# Patient Record
Sex: Male | Born: 1985 | Race: White | Hispanic: No | Marital: Single | Smoking: Never smoker
Health system: Southern US, Community
[De-identification: ages and names within clinical notes are randomized; demographics above are authoritative.]

---

## 2011-06-28 ENCOUNTER — Emergency Department: Payer: Self-pay | Admitting: Emergency Medicine

## 2011-09-21 ENCOUNTER — Emergency Department: Payer: Self-pay | Admitting: *Deleted

## 2013-02-24 IMAGING — CT CT HEAD WITHOUT CONTRAST
2 series · 16 of 30 positions shown, 20 images · non-contrast
Comparison: none

REASON FOR EXAM: headache 1 month
COMMENTS:   LMP: (Male)

[Series 2: without · axial · non-contrast · 0.45mm/px · z∈[-95,+25]mm · 13 of 30 slices shown, 17 images]
[im 3/30  brain]
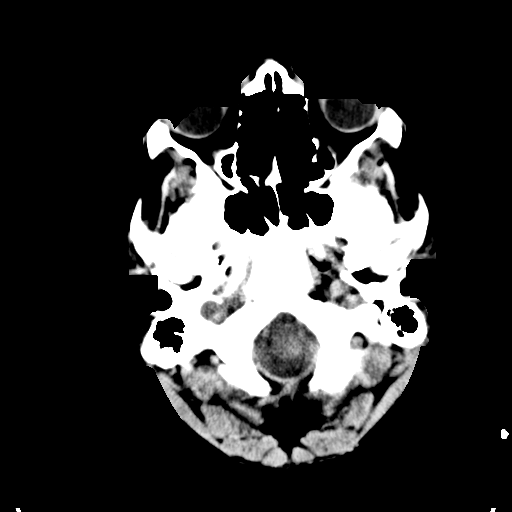
[im 3/30  bone]
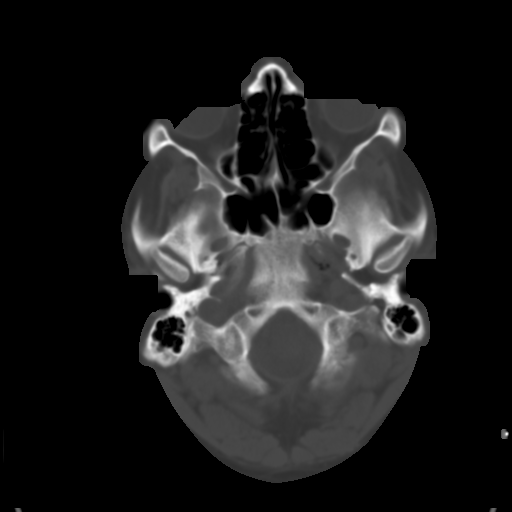
[im 5/30  brain]
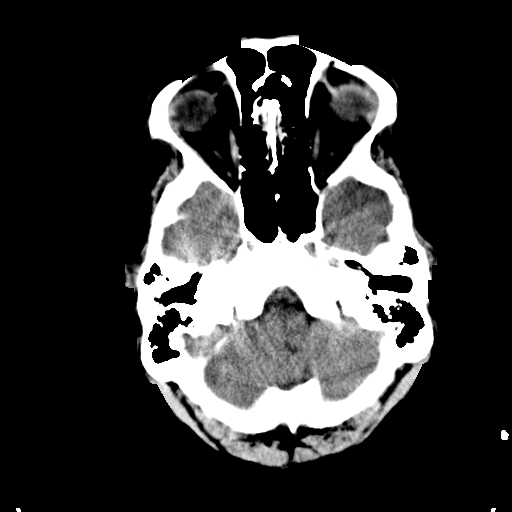
[im 7/30  brain]
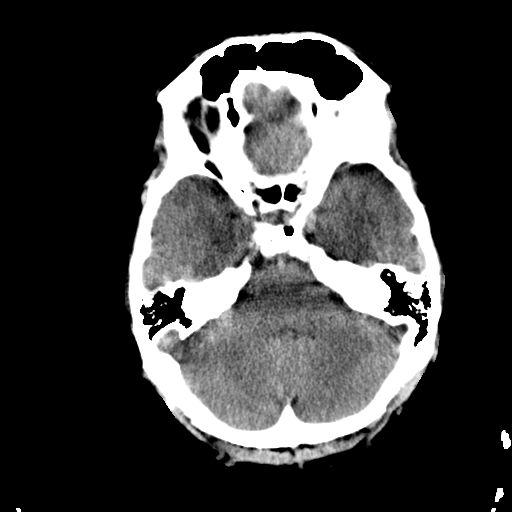
[im 9/30  brain]
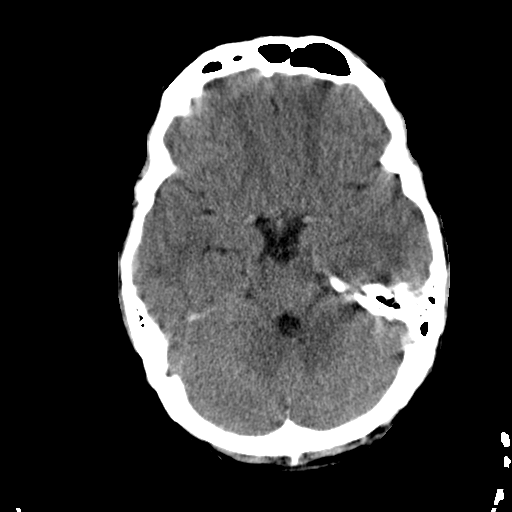
[im 11/30  brain]
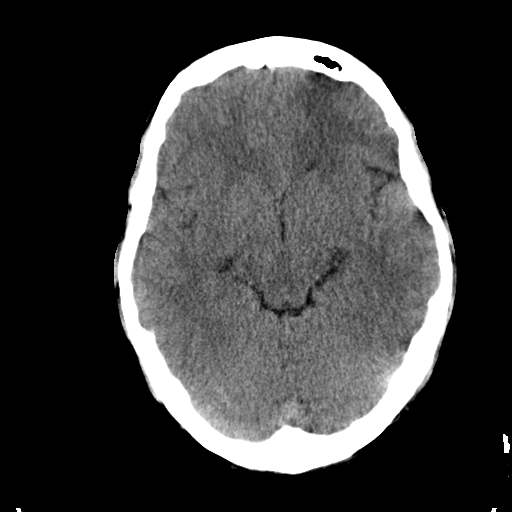
[im 11/30  bone]
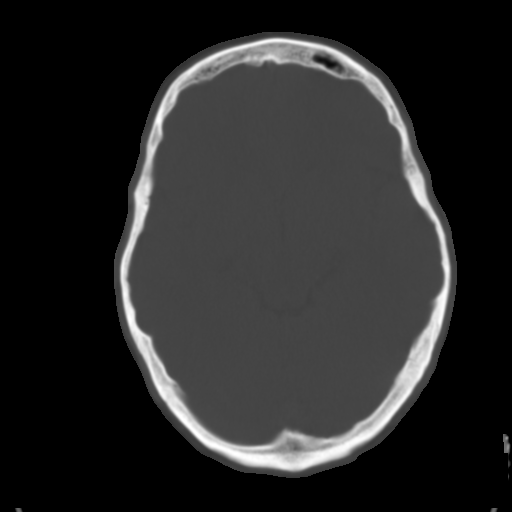
[im 13/30  brain]
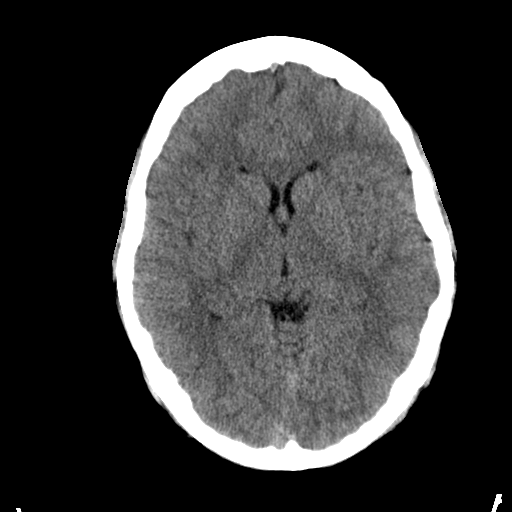
[im 15/30  brain]
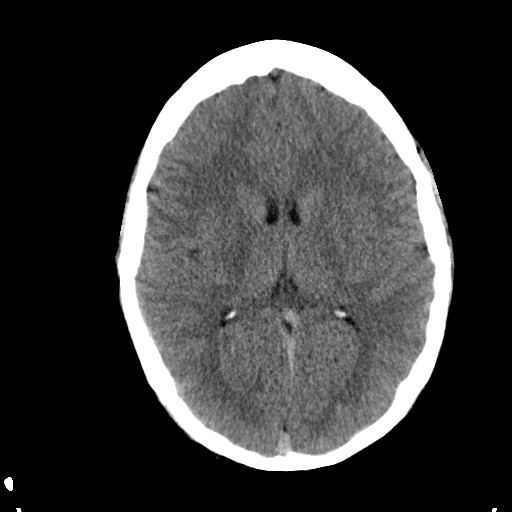
[im 17/30  brain]
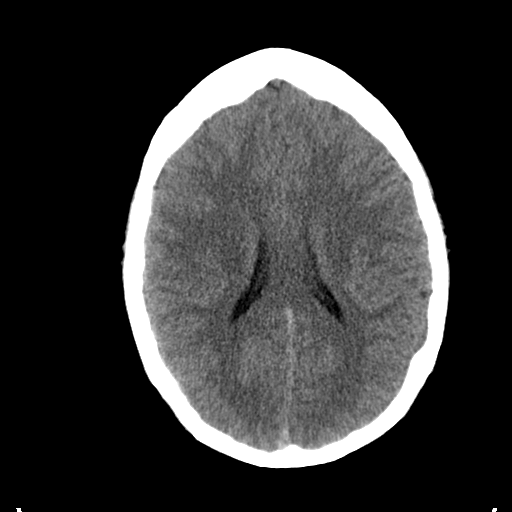
[im 19/30  brain]
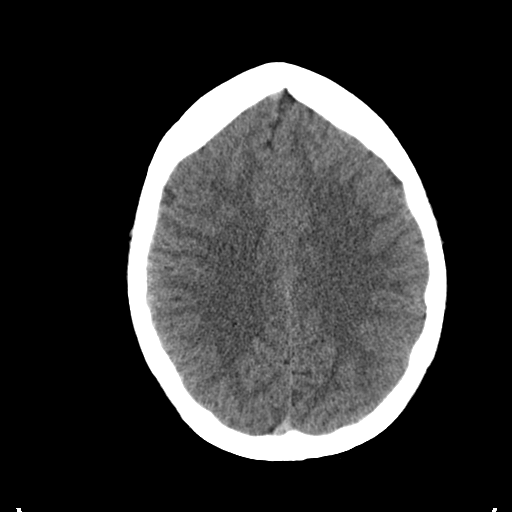
[im 19/30  bone]
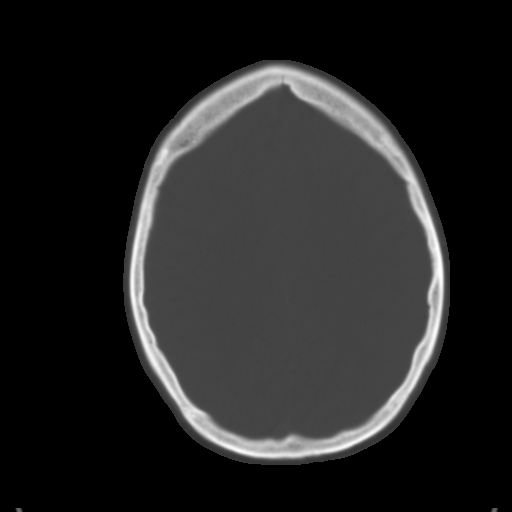
[im 21/30  brain]
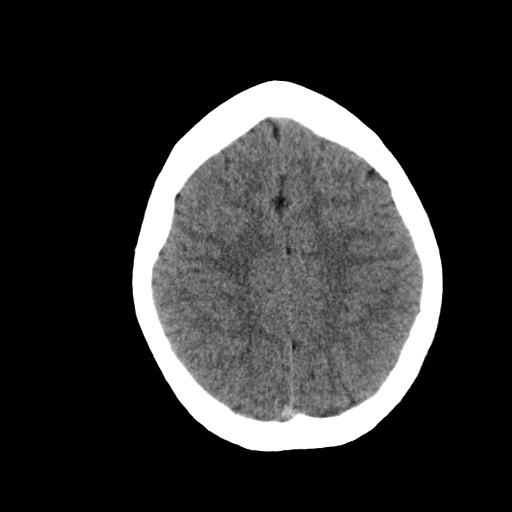
[im 23/30  brain]
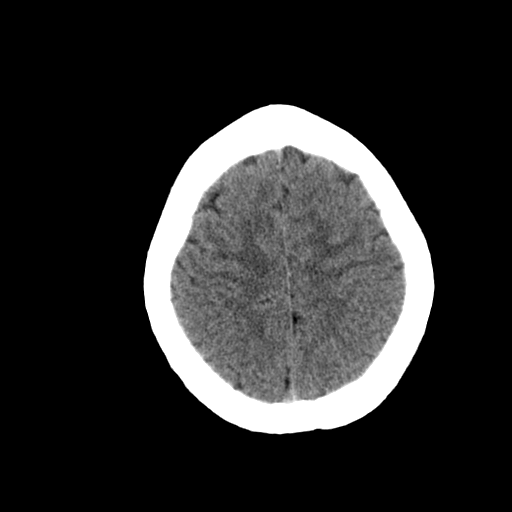
[im 25/30  brain]
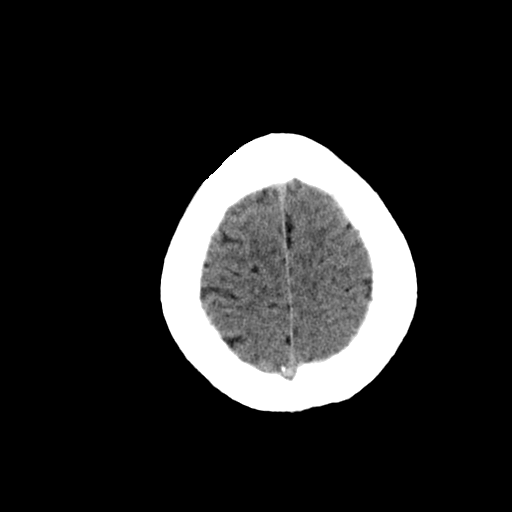
[im 27/30  brain]
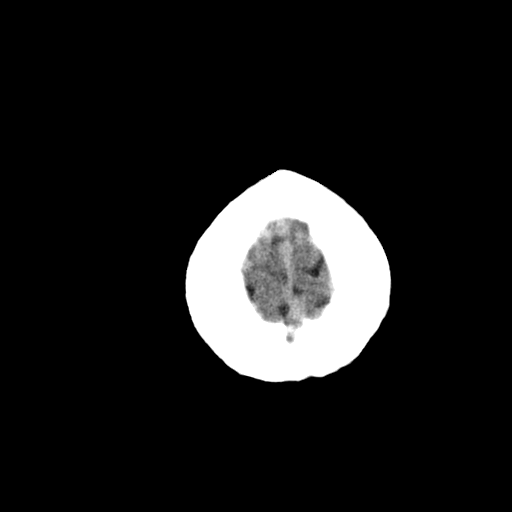
[im 27/30  bone]
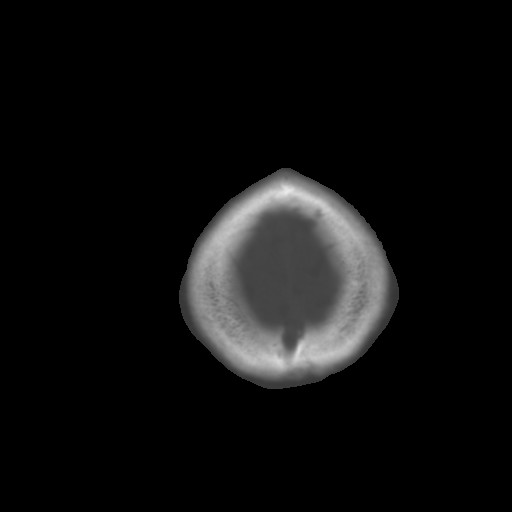

[Series 3: bone · axial · 0.45mm/px · z∈[-95,-55]mm · 3 of 30 slices shown]
[im 3/30  bone]
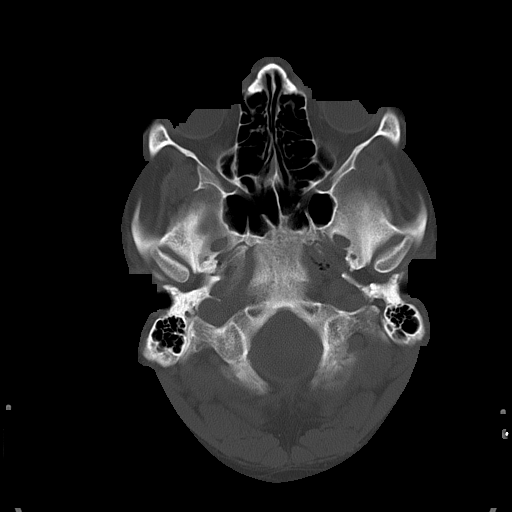
[im 7/30  bone]
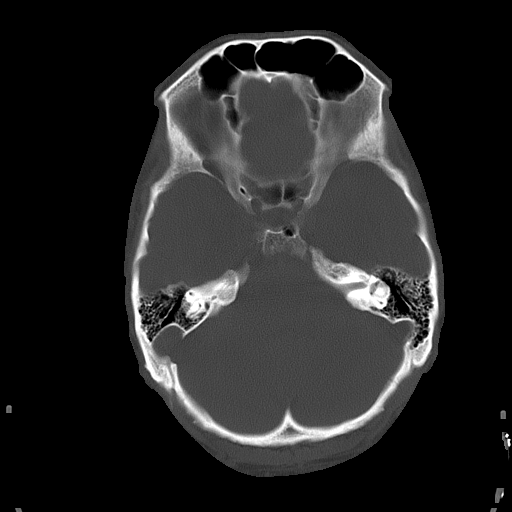
[im 11/30  bone]
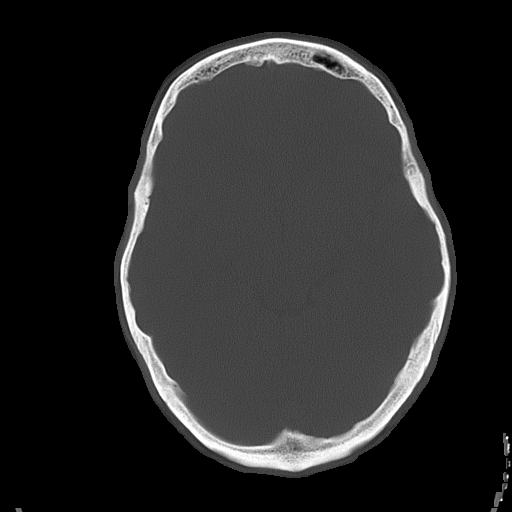

[16 of 30 positions shown; findings below may reference images not displayed]

PROCEDURE:     CT  - CT HEAD WITHOUT CONTRAST  - June 28, 2011 [DATE]

RESULT:     Noncontrast emergent CT of the brain is performed in the
standard fashion. The patient has no previous exam for comparison.

The ventricles and sulci are normal. There is no hemorrhage. There is no
focal mass, mass-effect or midline shift. There is no evidence of edema or
territorial infarct. The bone windows demonstrate normal aeration of the
paranasal sinuses and mastoid air cells. There is no skull fracture
demonstrated.
IMPRESSION: 1. No acute intracranial abnormality.

## 2013-09-27 ENCOUNTER — Ambulatory Visit: Payer: Self-pay | Admitting: Neurology

## 2015-05-21 ENCOUNTER — Emergency Department
Admission: EM | Admit: 2015-05-21 | Discharge: 2015-05-21 | Disposition: A | Discharge status: Home / Self Care | Payer: BLUE CROSS/BLUE SHIELD | Attending: Emergency Medicine | Admitting: Emergency Medicine

## 2015-05-21 ENCOUNTER — Emergency Department: Payer: BLUE CROSS/BLUE SHIELD

## 2015-05-21 ENCOUNTER — Encounter: Payer: Self-pay | Admitting: Emergency Medicine

## 2015-05-21 DIAGNOSIS — S93402A Sprain of unspecified ligament of left ankle, initial encounter: Secondary | ICD-10-CM

## 2015-05-21 DIAGNOSIS — Y9301 Activity, walking, marching and hiking: Secondary | ICD-10-CM | POA: Diagnosis not present

## 2015-05-21 DIAGNOSIS — S93602A Unspecified sprain of left foot, initial encounter: Secondary | ICD-10-CM | POA: Insufficient documentation

## 2015-05-21 DIAGNOSIS — Y998 Other external cause status: Secondary | ICD-10-CM | POA: Insufficient documentation

## 2015-05-21 DIAGNOSIS — Y9289 Other specified places as the place of occurrence of the external cause: Secondary | ICD-10-CM | POA: Insufficient documentation

## 2015-05-21 DIAGNOSIS — S99922A Unspecified injury of left foot, initial encounter: Secondary | ICD-10-CM | POA: Diagnosis present

## 2015-05-21 DIAGNOSIS — Z880 Allergy status to penicillin: Secondary | ICD-10-CM | POA: Insufficient documentation

## 2015-05-21 DIAGNOSIS — X58XXXA Exposure to other specified factors, initial encounter: Secondary | ICD-10-CM | POA: Insufficient documentation

## 2015-05-21 MED ORDER — IBUPROFEN 800 MG PO TABS: 800.0000 mg | ORAL_TABLET | Freq: Three times a day (TID) | ORAL | Status: DC | PRN

## 2015-05-21 MED ORDER — IBUPROFEN 800 MG PO TABS
800.0000 mg | ORAL_TABLET | Freq: Once | ORAL | Status: AC
  Administered 2015-05-21: 800 mg via ORAL
  Filled 2015-05-21: qty 1

## 2015-05-21 MED ORDER — TRAMADOL HCL 50 MG PO TABS: 50.0000 mg | ORAL_TABLET | Freq: Three times a day (TID) | ORAL | Status: DC | PRN

## 2015-05-21 MED ORDER — TRAMADOL HCL 50 MG PO TABS
50.0000 mg | ORAL_TABLET | Freq: Once | ORAL | Status: AC
  Administered 2015-05-21: 50 mg via ORAL
  Filled 2015-05-21: qty 1

## 2015-05-21 NOTE — ED Notes (Signed)
States he stepped wrong on sat having pain to left foot  Unable to bear wt

## 2015-05-21 NOTE — ED Provider Notes (Signed)
[facility] Emergency Department Provider Note  ____________________________________________  Time seen: Approximately 12:23 PM  I have reviewed the triage vital signs and the nursing notes.   HISTORY  Chief Complaint Foot Pain    HPI Hector Cohen is a 29 y.o. male presents to the ER for complaints of left foot and left ankle pain. Patient states that Saturday he was walking down steps and he states that he stepped on the step wrong causing onset of pain to the left foot and ankle. Denies rolling ankle or foot and denies falling. Denies fall or other injury. States pain is only in his left foot and left ankle. Denies pain radiation.  Patient states that pain is currently 5 out of 10. States that it is a aching pain with intermittent throbbing pain. Denies numbness or tingling sensations. Reports has continued to ambulate but with pain.   History reviewed. No pertinent past medical history.  There are no active problems to display for this patient.   History reviewed. No pertinent past surgical history.  No current outpatient prescriptions on file.  Allergies Penicillins  History reviewed. No pertinent family history.  Social History History  Substance Use Topics  . Smoking status: Never Smoker   . Smokeless tobacco: Not on file  . Alcohol Use: No    Review of Systems Constitutional: No fever/chills Eyes: No visual changes. ENT: No sore throat. Cardiovascular: Denies chest pain. Respiratory: Denies shortness of breath. Gastrointestinal: No abdominal pain.  No nausea, no vomiting.  No diarrhea.  No constipation. Genitourinary: Negative for dysuria. Musculoskeletal: Negative for back pain. Left foot and left ankle pain. Skin: Negative for rash. Neurological: Negative for headaches, focal weakness or numbness.  10-point ROS otherwise negative.  ____________________________________________   PHYSICAL EXAM:  VITAL SIGNS: ED  Triage Vitals  Enc Vitals Group     BP 05/21/15 1029 141/92 mmHg     Pulse Rate 05/21/15 1029 110     Resp 05/21/15 1029 18     Temp 05/21/15 1029 98 F (36.7 C)     Temp Source 05/21/15 1029 Oral     SpO2 05/21/15 1029 100 %     Weight 05/21/15 1029 280 lb (127.007 kg)     Height 05/21/15 1029 6\' 5"  (1.956 m)     Head Cir --      Peak Flow --      Pain Score 05/21/15 1029 10     Pain Loc --      Pain Edu? --      Excl. in [place]? --   Blood pressure 137/85, pulse 74, temperature 98 F (36.7 C), temperature source Oral, resp. rate 18, height 6\' 5"  (1.956 m), weight 280 lb (127.007 kg), SpO2 96 %.   Constitutional: Alert and oriented. Well appearing and in no acute distress. Eyes: Conjunctivae are normal. PERRL. EOMI. Head: Atraumatic.  Nose: No congestion/rhinnorhea.  Mouth/Throat: Mucous membranes are moist.  Neck: No stridor.  No cervical spine tenderness to palpation. Hematological/Lymphatic/Immunilogical: No cervical lymphadenopathy. Cardiovascular: Normal rate, regular rhythm. Grossly normal heart sounds.  Good peripheral circulation. Respiratory: Normal respiratory effort.  No retractions. Lungs CTAB. Gastrointestinal: Soft and nontender. No distention. Normal Bowel sounds.  No CVA tenderness. Musculoskeletal: No lower or upper extremity tenderness nor edema.  No joint effusions. Bilateral pedal pulses equal and easily palpated.  Except: Left lateral ankle and anterior ankle and upper dorsal foot mild to moderate tender palpation. No swelling, no erythema. Bilateral pulses equal and easily palpated.  Pain with ankle rotation. Full range of motion present. No posterior ankle tenderness. No Achilles tendon tenderness. Bilateral dorsiflexion and plantar flexion strong and equal bilaterally. Sensation intact to bilateral feet. Left lower extremity nontender above the left ankle. Neurologic:  Normal speech and language. No gross focal neurologic deficits are appreciated. No gait  instability. Skin:  Skin is warm, dry and intact. No rash noted. Psychiatric: Mood and affect are normal. Speech and behavior are normal.  ____________________________________________   LABS (all labs ordered are listed, but only abnormal results are displayed)  Labs Reviewed - No data to display _ RADIOLOGY EXAM: LEFT FOOT - COMPLETE 3+ VIEW  COMPARISON: None.  FINDINGS: There is no evidence of fracture or dislocation. There is no evidence of arthropathy or other focal bone abnormality. Soft tissues are unremarkable.  IMPRESSION: Negative.   Electronically Signed By: Conchita Paris M.D. On: 05/21/2015 11:15          DG Ankle Complete Left (Final result) Result time: 05/21/15 11:16:37   Final result by Rad Results In Interface (05/21/15 11:16:37)   Narrative:   CLINICAL DATA: Fall  EXAM: LEFT ANKLE COMPLETE - 3+ VIEW  COMPARISON: None.  FINDINGS: There is no evidence of fracture, dislocation, or joint effusion. There is no evidence of arthropathy or other focal bone abnormality. Soft tissues are unremarkable.  IMPRESSION: Negative.   Electronically Signed By: Franchot Gallo M.D. On: 05/21/2015 11:16   I, [name], personally viewed and evaluated these images as part of my medical decision making.    ____________________________________________   PROCEDURES  Procedure(s) performed:  SPLINT APPLICATION Date/Time: 95:28 PM Authorized by: [name] Consent: Verbal consent obtained. Risks and benefits: risks, benefits and alternatives were discussed Consent given by: patient Splint applied by: edtechnician Location details: left ankle  Splint type: stirrup Supplies used: velcro and crutches Post-procedure: The splinted body part was neurovascularly unchanged following the procedure. Patient tolerance: Patient tolerated the procedure well with no immediate  complications.   ___________________________________________   INITIAL IMPRESSION / ASSESSMENT AND PLAN / ED COURSE  Pertinent labs & imaging results that were available during my care of the patient were reviewed by me and considered in my medical decision making (see chart for details).  Well appearing. No acute distress. Left foot and ankle pain post stepping on step and states "stepped wrong". Denies fall or other injury. Pain to left foot and ankle since. Left foot and ankle xrays negative. Apply ice, elevate splint and crutches. Follow up with orthopedic for continued pain. Discussed follow up and return parameters. Patient agreed to plan.  ____________________________________________   FINAL CLINICAL IMPRESSION(S) / ED DIAGNOSES  Final diagnoses:  Foot sprain, left, initial encounter  Ankle sprain, left, initial encounter       [name], NP 05/21/15 Andrew, MD 05/21/15 1539

## 2015-05-21 NOTE — Discharge Instructions (Signed)
Take medication as prescribed. Apply ice and elevate. Wear splint and crutches as long as pain continues.  Follow-up with your primary care physician next week as needed. Follow-up with orthopedic next week as needed for continued pain. See above to call to schedule.  Return to the ER for new or worsening concerns.   Ankle Sprain An ankle sprain is an injury to the strong, fibrous tissues (ligaments) that hold the bones of your ankle joint together.  CAUSES An ankle sprain is usually caused by a fall or by twisting your ankle. Ankle sprains most commonly occur when you step on the outer edge of your foot, and your ankle turns inward. People who participate in sports are more prone to these types of injuries.  SYMPTOMS   Pain in your ankle. The pain may be present at rest or only when you are trying to stand or walk.  Swelling.  Bruising. Bruising may develop immediately or within 1 to 2 days after your injury.  Difficulty standing or walking, particularly when turning corners or changing directions. DIAGNOSIS  Your caregiver will ask you details about your injury and perform a physical exam of your ankle to determine if you have an ankle sprain. During the physical exam, your caregiver will press on and apply pressure to specific areas of your foot and ankle. Your caregiver will try to move your ankle in certain ways. An X-ray exam may be done to be sure a bone was not broken or a ligament did not separate from one of the bones in your ankle (avulsion fracture).  TREATMENT  Certain types of braces can help stabilize your ankle. Your caregiver can make a recommendation for this. Your caregiver may recommend the use of medicine for pain. If your sprain is severe, your caregiver may refer you to a surgeon who helps to restore function to parts of your skeletal system (orthopedist) or a physical therapist. [place] ice to your injury for 1-2 days or as directed by your  caregiver. Applying ice helps to reduce inflammation and pain.  Put ice in a plastic bag.  Place a towel between your skin and the bag.  Leave the ice on for 15-20 minutes at a time, every 2 hours while you are awake.  Only take over-the-counter or prescription medicines for pain, discomfort, or fever as directed by your caregiver.  Elevate your injured ankle above the level of your heart as much as possible for 2-3 days.  If your caregiver recommends crutches, use them as instructed. Gradually put weight on the affected ankle. Continue to use crutches or a cane until you can walk without feeling pain in your ankle.  If you have a plaster splint, wear the splint as directed by your caregiver. Do not rest it on anything harder than a pillow for the first 24 hours. Do not put weight on it. Do not get it wet. You may take it off to take a shower or bath.  You may have been given an elastic bandage to wear around your ankle to provide support. If the elastic bandage is too tight (you have numbness or tingling in your foot or your foot becomes cold and blue), adjust the bandage to make it comfortable.  If you have an air splint, you may blow more air into it or let air out to make it more comfortable. You may take your splint off at night and before taking a shower or bath. Wiggle your toes  in the splint several times per day to decrease swelling. SEEK MEDICAL CARE IF:   You have rapidly increasing bruising or swelling.  Your toes feel extremely cold or you lose feeling in your foot.  Your pain is not relieved with medicine. SEEK IMMEDIATE MEDICAL CARE IF:  Your toes are numb or blue.  You have severe pain that is increasing. MAKE SURE YOU:   Understand these instructions.  Will watch your condition.  Will get help right away if you are not doing well or get worse. Document Released: 10/13/2005 Document Revised: 07/07/2012 Document Reviewed: 10/25/2011 [facility] Patient Information  2015 [place], [place]. This information is not intended to replace advice given to you by your health care provider. Make sure you discuss any questions you have with your health care provider.

## 2015-06-26 ENCOUNTER — Ambulatory Visit (INDEPENDENT_AMBULATORY_CARE_PROVIDER_SITE_OTHER): Payer: BLUE CROSS/BLUE SHIELD

## 2015-06-26 ENCOUNTER — Ambulatory Visit (INDEPENDENT_AMBULATORY_CARE_PROVIDER_SITE_OTHER): Payer: BLUE CROSS/BLUE SHIELD | Admitting: Podiatry

## 2015-06-26 ENCOUNTER — Encounter: Payer: Self-pay | Admitting: Podiatry

## 2015-06-26 VITALS — BP 167/119 | HR 86 | Resp 18

## 2015-06-26 DIAGNOSIS — Q667 Congenital pes cavus: Secondary | ICD-10-CM | POA: Diagnosis not present

## 2015-06-26 DIAGNOSIS — M779 Enthesopathy, unspecified: Secondary | ICD-10-CM | POA: Diagnosis not present

## 2015-06-26 DIAGNOSIS — L84 Corns and callosities: Secondary | ICD-10-CM

## 2015-06-26 DIAGNOSIS — R52 Pain, unspecified: Secondary | ICD-10-CM

## 2015-06-26 DIAGNOSIS — M216X9 Other acquired deformities of unspecified foot: Secondary | ICD-10-CM

## 2015-06-26 MED ORDER — MELOXICAM 15 MG PO TABS: 15.0000 mg | ORAL_TABLET | Freq: Every day | ORAL | Status: DC

## 2015-06-26 NOTE — Patient Instructions (Signed)
Peroneal Tendinitis with Rehab Tendonitis is inflammation of a tendon. Inflammation of the tendons on the back of the outer ankle (peroneal tendons) is known as peroneal tendonitis. The peroneal tendons are responsible for connecting the muscles that allow you to stand on your tiptoes to the bones of the ankle. For this reason, peroneal tendonitis often causes pain when trying to complete such motions. Peroneal tendonitis often involves a tear (strain) of the peroneal tendons. Strains are classified into three categories. Grade 1 strains cause pain, but the tendon is not lengthened. Grade 2 strains include a lengthened ligament, due to the ligament being stretched or partially ruptured. With grade 2 strains there is still function, although function may be decreased. Grade 3 strains involve a complete tear of the tendon or muscle, and function is usually impaired. SYMPTOMS   Pain, tenderness, swelling, warmth, or redness over the back of the outer side of the ankle, the outer part of the mid-foot, or the bottom of the arch.  Pain that gets worse with ankle motion (especially when pushing off or pushing down with the front of the foot), or when standing on the ball of the foot or pushing the foot outward.  Crackling sound (crepitation) when the tendon is moved or touched. CAUSES  Peroneal tendinitis occurs when injury to the peroneal tendons causes the body to respond with inflammation. Common causes of injury include:  An overuse injury, in which the groove behind the outer ankle (where the tendon is located) causes wear on the tendon.  A sudden stress placed on the tendon, such as from an increase in the intensity, frequency, or duration of training.  Direct hit (trauma) to the tendon.  Return to activity too soon after a previous ankle injury. RISK INCREASES WITH:  Sports that require sudden, repetitive pushing off of the foot, such as jumping or quick starts.  Kicking and running sports,  especially running down hills or long distances.  Poor strength and flexibility.  Previous injury to the foot, ankle, or leg. PREVENTION  Warm up and stretch properly before activity.  Allow for adequate recovery between workouts.  Maintain physical fitness:  Strength, flexibility, and endurance.  Cardiovascular fitness.  Complete rehabilitation after previous injury. PROGNOSIS  If treated properly, peroneal tendonitis usually heals within 6 weeks.  RELATED COMPLICATIONS  Longer healing time, if not properly treated or if not given enough time to heal.  Recurring symptoms if activity is resumed too soon, with overuse, or when using poor technique.  If untreated, tendinitis may result in tendon rupture, requiring surgery. TREATMENT  Treatment first involves the use of ice and medicine to reduce pain and inflammation. The use of strengthening and stretching exercises may help reduce pain with activity. These exercises may be performed at home or with a therapist. Sometimes, the foot and ankle will be restrained for 10 to 14 days to promote healing. Your caregiver may advise that you place a heel lift in your shoes to reduce the stress placed on the tendon. If nonsurgical treatment is unsuccessful, surgery to remove the inflamed tendon lining (sheath) may be advised.  MEDICATION   If pain medicine is needed, nonsteroidal anti-inflammatory medicines (aspirin and ibuprofen), or other minor pain relievers (acetaminophen), are often advised.  Do not take pain medicine for 7 days before surgery.  Prescription pain relievers may be given, if your caregiver thinks they are needed. Use only as directed and only as much as you need. HEAT AND COLD  Cold treatment (icing) should   be applied for 10 to 15 minutes every 2 to 3 hours for inflammation and pain, and immediately after activity that aggravates your symptoms. Use ice packs or an ice massage.  Heat treatment may be used before  performing stretching and strengthening activities prescribed by your caregiver, physical therapist, or athletic trainer. Use a heat pack or a warm water soak. SEEK MEDICAL CARE IF:  Symptoms get worse or do not improve in 2 to 4 weeks, despite treatment.  New, unexplained symptoms develop. (Drugs used in treatment may produce side effects.) EXERCISES RANGE OF MOTION (ROM) AND STRETCHING EXERCISES - Peroneal Tendinitis These exercises may help you when beginning to rehabilitate your injury. Your symptoms may resolve with or without further involvement from your physician, physical therapist or athletic trainer. While completing these exercises, remember:   Restoring tissue flexibility helps normal motion to return to the joints. This allows healthier, less painful movement and activity.  An effective stretch should be held for at least 30 seconds.  A stretch should never be painful. You should only feel a gentle lengthening or release in the stretched tissue. RANGE OF MOTION - Ankle Eversion  Sit with your right / left ankle crossed over your opposite knee.  Grip your foot with your opposite hand, placing your thumb on the top of your foot and your fingers across the bottom of your foot.  Gently push your foot downward with a slight rotation, so your littlest toes rise slightly toward the ceiling.  You should feel a gentle stretch on the inside of your ankle. Hold the stretch for __________ seconds. Repeat __________ times. Complete this exercise __________ times per day.  RANGE OF MOTION - Ankle Inversion  Sit with your right / left ankle crossed over your opposite knee.  Grip your foot with your opposite hand, placing your thumb on the bottom of your foot and your fingers across the top of your foot.  Gently pull your foot so the smallest toe comes toward you and your thumb pushes the inside of the ball of your foot away from you.  You should feel a gentle stretch on the outside of  your ankle. Hold the stretch for __________ seconds. Repeat __________ times. Complete this exercise __________ times per day.  RANGE OF MOTION - Ankle Plantar Flexion  Sit with your right / left leg crossed over your opposite knee.  Use your opposite hand to pull the top of your foot and toes toward you.  You should feel a gentle stretch on the top of your foot and ankle. Hold this position for __________ seconds. Repeat __________ times. Complete __________ times per day.  STRETCH - Gastroc, Standing  Place your hands on a wall.  Extend your right / left leg behind you, keeping the front knee somewhat bent.  Slightly point your toes inward on your back foot.  Keeping your right / left heel on the floor and your knee straight, shift your weight toward the wall, not allowing your back to arch.  You should feel a gentle stretch in the calf. Hold this position for __________ seconds. Repeat __________ times. Complete this stretch __________ times per day. STRETCH - Soleus, Standing  Place your hands on a wall.  Extend your right / left leg behind you, keeping the other knee somewhat bent.  Slightly point your toes inward on your back foot.  Keep your heel on the floor, bend your back knee, and slightly shift your weight over the back leg so that   you feel a gentle stretch deep in your back calf.  Hold this position for __________ seconds. Repeat __________ times. Complete this stretch __________ times per day. STRETCH - Gastrocsoleus, Standing Note: This exercise can place a lot of stress on your foot and ankle. Please complete this exercise only if specifically instructed by your caregiver.   Place the ball of your right / left foot on a step, keeping your other foot firmly on the same step.  Hold on to the wall or a rail for balance.  Slowly lift your other foot, allowing your body weight to press your heel down over the edge of the step.  You should feel a stretch in your  right / left calf.  Hold this position for __________ seconds.  Repeat this exercise with a slight bend in your knee. Repeat __________ times. Complete this stretch __________ times per day.  STRENGTHENING EXERCISES - Peroneal Tendinitis  These exercises may help you when beginning to rehabilitate your injury. They may resolve your symptoms with or without further involvement from your physician, physical therapist or athletic trainer. While completing these exercises, remember:   Muscles can gain both the endurance and the strength needed for everyday activities through controlled exercises.  Complete these exercises as instructed by your physician, physical therapist or athletic trainer. Increase the resistance and repetitions only as guided by your caregiver. STRENGTH - Dorsiflexors  Secure a rubber exercise band or tubing to a fixed object (table, pole) and loop the other end around your right / left foot.  Sit on the floor facing the fixed object. The band should be slightly tense when your foot is relaxed.  Slowly draw your foot back toward you, using your ankle and toes.  Hold this position for __________ seconds. Slowly release the tension in the band and return your foot to the starting position. Repeat __________ times. Complete this exercise __________ times per day.  STRENGTH - Towel Curls  Sit in a chair, on a non-carpeted surface.  Place your foot on a towel, keeping your heel on the floor.  Pull the towel toward your heel only by curling your toes. Keep your heel on the floor.  If instructed by your physician, physical therapist or athletic trainer, add weight to the end of the towel. Repeat __________ times. Complete this exercise __________ times per day. STRENGTH - Ankle Eversion   Secure one end of a rubber exercise band or tubing to a fixed object (table, pole). Loop the other end around your foot, just before your toes.  Place your fists between your knees.  This will focus your strengthening at your ankle.  Drawing the band across your opposite foot, away from the pole, slowly, pull your little toe out and up. Make sure the band is positioned to resist the entire motion.  Hold this position for __________ seconds.  Have your muscles resist the band, as it slowly pulls your foot back to the starting position. Repeat __________ times. Complete this exercise __________ times per day.  Document Released: 10/13/2005 Document Revised: 02/27/2014 Document Reviewed: 01/25/2009 [employer] Patient Information 2015 [employer]. This information is not intended to replace advice given to you by your health care provider. Make sure you discuss any questions you have with your health care provider.  

## 2015-06-26 NOTE — Progress Notes (Signed)
   Subjective:    Patient ID: Hector Cohen, male    DOB: Aug 14, 1986, 29 y.o.   MRN: [id number]  HPI  29 year old male presents the office with concerns of left foot pain which is been ongoing for approximate 4 weeks. He states the pain started after he stated for period of 4 hours and the dress shoe.he describes it as a throbbing pain. He states he has pain overlying the outside portion of left foot for which she points to the submetatarsal 5 area overlying hyperkeratotic lesion. He also states he has some pain on the outside part of his foot for which she points the fifth metatarsal base. He states he has pain when he stands on his feet for prolonged periods of time. He denies any swelling or redness. He does state that approximately one month ago before this started he did sprain his ankle for which she was seen in the emergency department. He states that pain is resolved. He said no prior treatment for the foot. No other complaints at this time.  Review of Systems  Constitutional: Positive for activity change.  HENT: Positive for sinus pressure.   Musculoskeletal: Positive for gait problem.  Skin: Positive for rash.  Allergic/Immunologic: Positive for environmental allergies.  Psychiatric/Behavioral: The patient is nervous/anxious.        Objective:   Physical Exam AAO x3, NAD DP/PT pulses palpable bilaterally, CRT less than 3 seconds Protective sensation intact with Simms Weinstein monofilament, vibratory sensation intact, Achilles tendon reflex intact There are hyperkeratotic lesions bilateral submetatarsal 1, 5, plantar heel to the left foot. There is an increase in calcaneal inclination angle upon weightbearing. Patient states that he has multiple family members without high arch foot. There is tenderness over the submetatarsal 5 hyperkeratotic lesion the left foot. Upon debridement no underlying ulceration, drainage or other signs of infection. There is tenderness palpation  overlying the fifth metatarsal base just proximally along the insertion of the peroneal tendon. There is no pain with vibratory sensation overlying the area. No pain with eversion. Peroneal tendons appear to be intact. There is no other areas of tenderness to bilateral lower extremities. No areas of tenderness to bilateral lower extremities. MMT 5/5, ROM WNL.  No open lesions or pre-ulcerative lesions.  No overlying edema, erythema, increase in warmth to bilateral lower extremities.  No pain with calf compression, swelling, warmth, erythema bilaterally.      Assessment & Plan:  29 year old male with left foot pain, insertionalperoneal tendinitis, symptomatic hyperkeratotic lesions. -X-rays were obtained and reviewed with the patient.  -Treatment options discussed including all alternatives, risks, and complications -Discussed likely etiology of his symptoms. -Dispensed Tri-Lock ankle brace to help control the peroneal tendons. Discussed stretching/rehabilitation exercises. As the pain subsides he can wean off the brace. -Prescribed mobic. Discussed side effects of the medication and directed to stop if any are to occur and call the office.  -Hyperkeratotic lesion shortly debrided without complication/bleeding. -Given his foot type by deeply that he could benefit well from orthotic. He'll try an over-the-counter insert. I discussed with him what to look for in purchasing  These. -Follow-up 4 weeks or sooner if any problems arise. In the meantime, encouraged to call the office with any questions, concerns, change in symptoms. Follow-up with PCP for other issues mentioned in the ROS.  Celesta Gentile, DPM

## 2015-07-24 ENCOUNTER — Ambulatory Visit (INDEPENDENT_AMBULATORY_CARE_PROVIDER_SITE_OTHER): Payer: BLUE CROSS/BLUE SHIELD | Admitting: Podiatry

## 2015-07-24 ENCOUNTER — Encounter: Payer: Self-pay | Admitting: Podiatry

## 2015-07-24 VITALS — BP 139/86 | HR 84 | Resp 18

## 2015-07-24 DIAGNOSIS — M779 Enthesopathy, unspecified: Secondary | ICD-10-CM | POA: Diagnosis not present

## 2015-07-24 NOTE — Progress Notes (Signed)
Patient ID: Hector Cohen, male   DOB: 06-07-1986, 29 y.o.   MRN: [id number]  Subjective: 29 year old male presents the office they for follow-up evaluation of left foot pain. He states that he is continuing  To wear the ankle brace if he is standing or walking for periods of time. He does not wear it all the time. He states that his pain is significantly improved compared to what it was last appointment. He continues his rehabilitation exercises as well. He has been taking anti-inflammatories as needed. No other complaints at this time in no acute changes since last appointment.  Objective: AAO 3, NAD Neurovascular status intact and unchanged At this time there is no tenderness palpation on the lateral aspect of the ankle or along the peroneal tendon and the insertion of the fifth metatarsal base. There is no pain with eversion. The peroneal tendons appear to be intact. There is increasing calcaneal inclination angle bilaterally. There is no other areas of tenderness to bilateral lower  extremities. No pinpoint bony tenderness. There is no overt edema, erythema, increase in warmth. No open lesions or pre-ulcerative lesions. There is no pain with calf compression, sewing, warmth, erythema.  Assessment: 29 year old male with resolving tendinitis left foot  Plan: -Treatment options discussed including all alternatives, risks, and complications -Recommended continued rehabilitation stretching exercises for peroneal tendon. Anti-inflammatories as needed. Continue with supportive shoe gear. I also believe that he would likely benefit from custom orthotics. Due to cost to look at an over-the-counter pair. I discussed the multiple performed purchase in these. -Follow-up with me if symptoms are not complete the results next 4 weeks or sooner if any problems are to arise. In the meantime I encouraged him to call the office with any questions, concerns, change in symptoms.  Celesta Gentile, DPM

## 2015-07-24 NOTE — Patient Instructions (Signed)
Peroneal Tendinitis with Rehab Tendonitis is inflammation of a tendon. Inflammation of the tendons on the back of the outer ankle (peroneal tendons) is known as peroneal tendonitis. The peroneal tendons are responsible for connecting the muscles that allow you to stand on your tiptoes to the bones of the ankle. For this reason, peroneal tendonitis often causes pain when trying to complete such motions. Peroneal tendonitis often involves a tear (strain) of the peroneal tendons. Strains are classified into three categories. Grade 1 strains cause pain, but the tendon is not lengthened. Grade 2 strains include a lengthened ligament, due to the ligament being stretched or partially ruptured. With grade 2 strains there is still function, although function may be decreased. Grade 3 strains involve a complete tear of the tendon or muscle, and function is usually impaired. SYMPTOMS   Pain, tenderness, swelling, warmth, or redness over the back of the outer side of the ankle, the outer part of the mid-foot, or the bottom of the arch.  Pain that gets worse with ankle motion (especially when pushing off or pushing down with the front of the foot), or when standing on the ball of the foot or pushing the foot outward.  Crackling sound (crepitation) when the tendon is moved or touched. CAUSES  Peroneal tendinitis occurs when injury to the peroneal tendons causes the body to respond with inflammation. Common causes of injury include:  An overuse injury, in which the groove behind the outer ankle (where the tendon is located) causes wear on the tendon.  A sudden stress placed on the tendon, such as from an increase in the intensity, frequency, or duration of training.  Direct hit (trauma) to the tendon.  Return to activity too soon after a previous ankle injury. RISK INCREASES WITH:  Sports that require sudden, repetitive pushing off of the foot, such as jumping or quick starts.  Kicking and running sports,  especially running down hills or long distances.  Poor strength and flexibility.  Previous injury to the foot, ankle, or leg. PREVENTION  Warm up and stretch properly before activity.  Allow for adequate recovery between workouts.  Maintain physical fitness:  Strength, flexibility, and endurance.  Cardiovascular fitness.  Complete rehabilitation after previous injury. PROGNOSIS  If treated properly, peroneal tendonitis usually heals within 6 weeks.  RELATED COMPLICATIONS  Longer healing time, if not properly treated or if not given enough time to heal.  Recurring symptoms if activity is resumed too soon, with overuse, or when using poor technique.  If untreated, tendinitis may result in tendon rupture, requiring surgery. TREATMENT  Treatment first involves the use of ice and medicine to reduce pain and inflammation. The use of strengthening and stretching exercises may help reduce pain with activity. These exercises may be performed at home or with a therapist. Sometimes, the foot and ankle will be restrained for 10 to 14 days to promote healing. Your caregiver may advise that you place a heel lift in your shoes to reduce the stress placed on the tendon. If nonsurgical treatment is unsuccessful, surgery to remove the inflamed tendon lining (sheath) may be advised.  MEDICATION   If pain medicine is needed, nonsteroidal anti-inflammatory medicines (aspirin and ibuprofen), or other minor pain relievers (acetaminophen), are often advised.  Do not take pain medicine for 7 days before surgery.  Prescription pain relievers may be given, if your caregiver thinks they are needed. Use only as directed and only as much as you need. HEAT AND COLD  Cold treatment (icing) should   be applied for 10 to 15 minutes every 2 to 3 hours for inflammation and pain, and immediately after activity that aggravates your symptoms. Use ice packs or an ice massage.  Heat treatment may be used before  performing stretching and strengthening activities prescribed by your caregiver, physical therapist, or athletic trainer. Use a heat pack or a warm water soak. SEEK MEDICAL CARE IF:  Symptoms get worse or do not improve in 2 to 4 weeks, despite treatment.  New, unexplained symptoms develop. (Drugs used in treatment may produce side effects.) EXERCISES RANGE OF MOTION (ROM) AND STRETCHING EXERCISES - Peroneal Tendinitis These exercises may help you when beginning to rehabilitate your injury. Your symptoms may resolve with or without further involvement from your physician, physical therapist or athletic trainer. While completing these exercises, remember:   Restoring tissue flexibility helps normal motion to return to the joints. This allows healthier, less painful movement and activity.  An effective stretch should be held for at least 30 seconds.  A stretch should never be painful. You should only feel a gentle lengthening or release in the stretched tissue. RANGE OF MOTION - Ankle Eversion  Sit with your right / left ankle crossed over your opposite knee.  Grip your foot with your opposite hand, placing your thumb on the top of your foot and your fingers across the bottom of your foot.  Gently push your foot downward with a slight rotation, so your littlest toes rise slightly toward the ceiling.  You should feel a gentle stretch on the inside of your ankle. Hold the stretch for __________ seconds. Repeat __________ times. Complete this exercise __________ times per day.  RANGE OF MOTION - Ankle Inversion  Sit with your right / left ankle crossed over your opposite knee.  Grip your foot with your opposite hand, placing your thumb on the bottom of your foot and your fingers across the top of your foot.  Gently pull your foot so the smallest toe comes toward you and your thumb pushes the inside of the ball of your foot away from you.  You should feel a gentle stretch on the outside of  your ankle. Hold the stretch for __________ seconds. Repeat __________ times. Complete this exercise __________ times per day.  RANGE OF MOTION - Ankle Plantar Flexion  Sit with your right / left leg crossed over your opposite knee.  Use your opposite hand to pull the top of your foot and toes toward you.  You should feel a gentle stretch on the top of your foot and ankle. Hold this position for __________ seconds. Repeat __________ times. Complete __________ times per day.  STRETCH - Gastroc, Standing  Place your hands on a wall.  Extend your right / left leg behind you, keeping the front knee somewhat bent.  Slightly point your toes inward on your back foot.  Keeping your right / left heel on the floor and your knee straight, shift your weight toward the wall, not allowing your back to arch.  You should feel a gentle stretch in the calf. Hold this position for __________ seconds. Repeat __________ times. Complete this stretch __________ times per day. STRETCH - Soleus, Standing  Place your hands on a wall.  Extend your right / left leg behind you, keeping the other knee somewhat bent.  Slightly point your toes inward on your back foot.  Keep your heel on the floor, bend your back knee, and slightly shift your weight over the back leg so that   you feel a gentle stretch deep in your back calf.  Hold this position for __________ seconds. Repeat __________ times. Complete this stretch __________ times per day. STRETCH - Gastrocsoleus, Standing Note: This exercise can place a lot of stress on your foot and ankle. Please complete this exercise only if specifically instructed by your caregiver.   Place the ball of your right / left foot on a step, keeping your other foot firmly on the same step.  Hold on to the wall or a rail for balance.  Slowly lift your other foot, allowing your body weight to press your heel down over the edge of the step.  You should feel a stretch in your  right / left calf.  Hold this position for __________ seconds.  Repeat this exercise with a slight bend in your knee. Repeat __________ times. Complete this stretch __________ times per day.  STRENGTHENING EXERCISES - Peroneal Tendinitis  These exercises may help you when beginning to rehabilitate your injury. They may resolve your symptoms with or without further involvement from your physician, physical therapist or athletic trainer. While completing these exercises, remember:   Muscles can gain both the endurance and the strength needed for everyday activities through controlled exercises.  Complete these exercises as instructed by your physician, physical therapist or athletic trainer. Increase the resistance and repetitions only as guided by your caregiver. STRENGTH - Dorsiflexors  Secure a rubber exercise band or tubing to a fixed object (table, pole) and loop the other end around your right / left foot.  Sit on the floor facing the fixed object. The band should be slightly tense when your foot is relaxed.  Slowly draw your foot back toward you, using your ankle and toes.  Hold this position for __________ seconds. Slowly release the tension in the band and return your foot to the starting position. Repeat __________ times. Complete this exercise __________ times per day.  STRENGTH - Towel Curls  Sit in a chair, on a non-carpeted surface.  Place your foot on a towel, keeping your heel on the floor.  Pull the towel toward your heel only by curling your toes. Keep your heel on the floor.  If instructed by your physician, physical therapist or athletic trainer, add weight to the end of the towel. Repeat __________ times. Complete this exercise __________ times per day. STRENGTH - Ankle Eversion   Secure one end of a rubber exercise band or tubing to a fixed object (table, pole). Loop the other end around your foot, just before your toes.  Place your fists between your knees.  This will focus your strengthening at your ankle.  Drawing the band across your opposite foot, away from the pole, slowly, pull your little toe out and up. Make sure the band is positioned to resist the entire motion.  Hold this position for __________ seconds.  Have your muscles resist the band, as it slowly pulls your foot back to the starting position. Repeat __________ times. Complete this exercise __________ times per day.  Document Released: 10/13/2005 Document Revised: 02/27/2014 Document Reviewed: 01/25/2009 [employer] Patient Information 2015 [employer]. This information is not intended to replace advice given to you by your health care provider. Make sure you discuss any questions you have with your health care provider.  

## 2016-11-28 ENCOUNTER — Other Ambulatory Visit: Payer: Self-pay | Admitting: Family Medicine

## 2016-11-28 DIAGNOSIS — R1011 Right upper quadrant pain: Secondary | ICD-10-CM

## 2016-12-05 ENCOUNTER — Ambulatory Visit
Admission: RE | Admit: 2016-12-05 | Discharge: 2016-12-05 | Disposition: A | Discharge status: Home / Self Care | Payer: BLUE CROSS/BLUE SHIELD | Source: Ambulatory Visit | Attending: Family Medicine | Admitting: Family Medicine

## 2016-12-05 DIAGNOSIS — R1011 Right upper quadrant pain: Secondary | ICD-10-CM | POA: Insufficient documentation

## 2016-12-05 DIAGNOSIS — K760 Fatty (change of) liver, not elsewhere classified: Secondary | ICD-10-CM | POA: Insufficient documentation

## 2017-12-03 IMAGING — US US ABDOMEN LIMITED
1 series · 14 of 25 positions shown · non-contrast
Comparison: None.

CLINICAL DATA: Right upper quadrant abdominal pain.

EXAM:
US ABDOMEN LIMITED - RIGHT UPPER QUADRANT

[Series 1: us abdomen limited · 0.26mm/px · 14 of 40 slices shown]
[im 1/40]
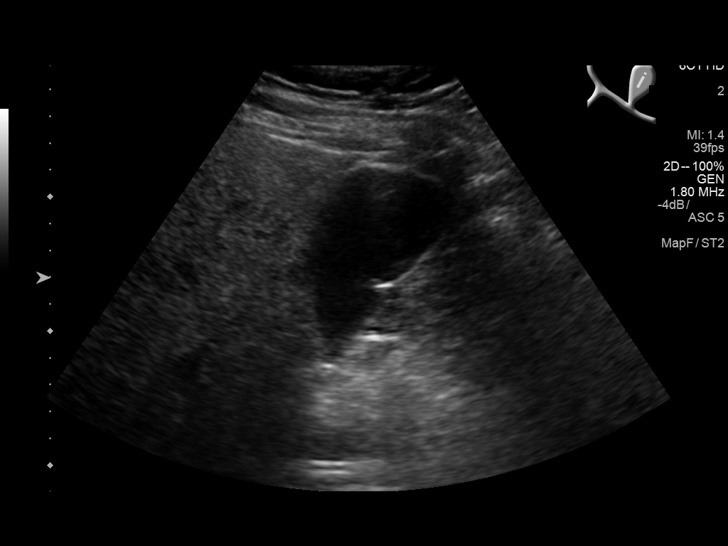
[im 4/40]
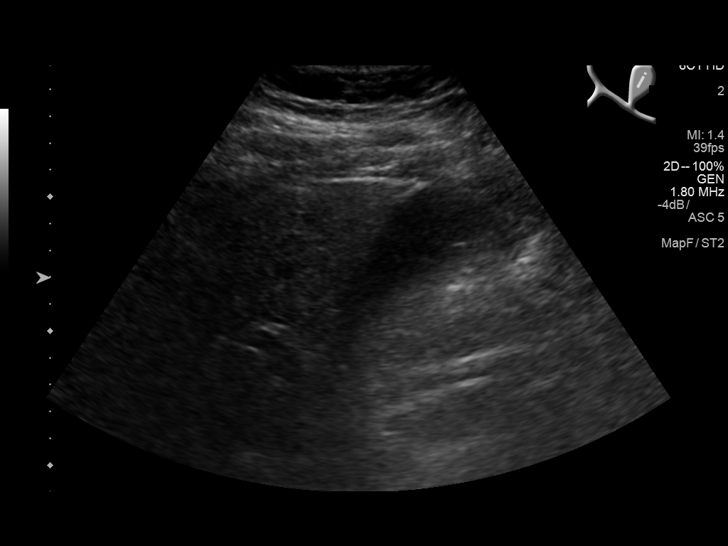
[im 7/40]
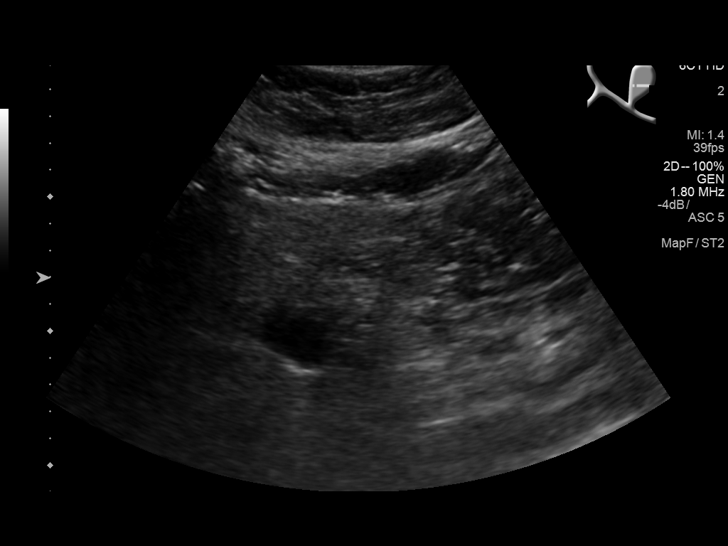
[im 10/40]
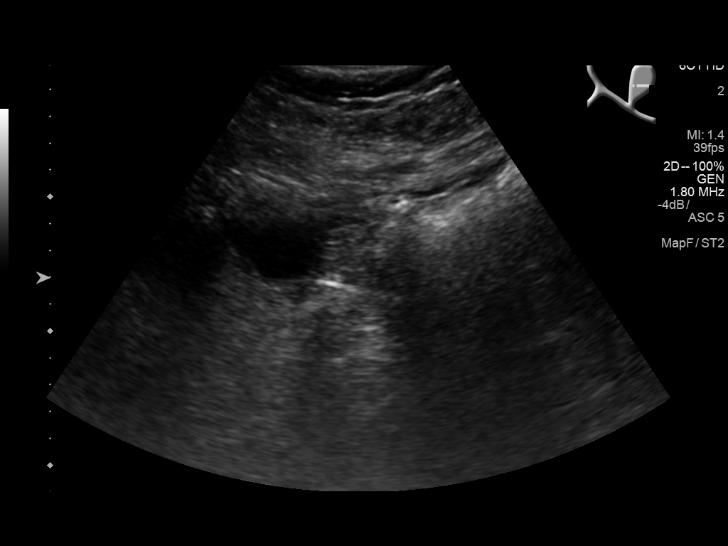
[im 14/40]
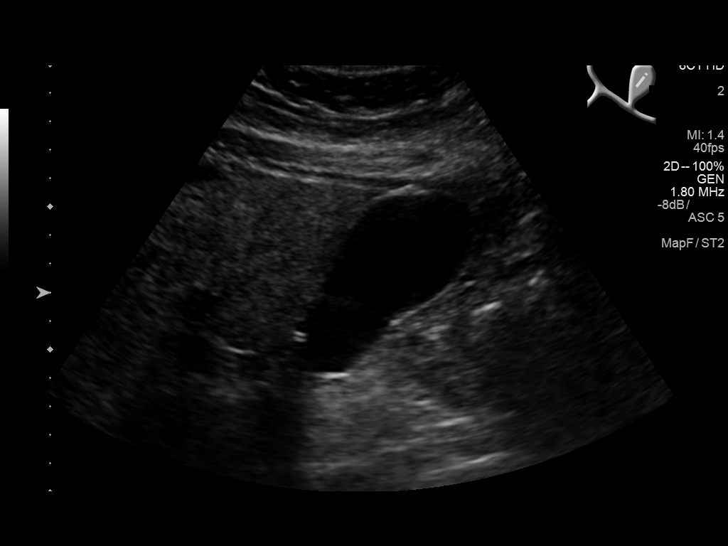
[im 15/40]
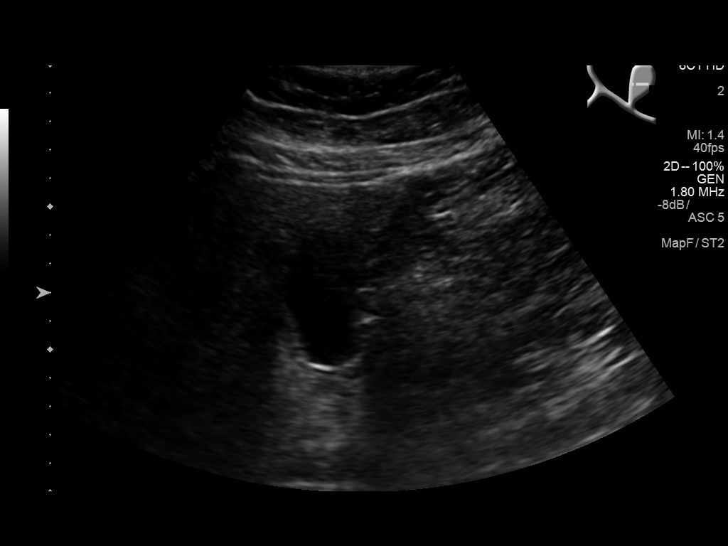
[im 18/40]
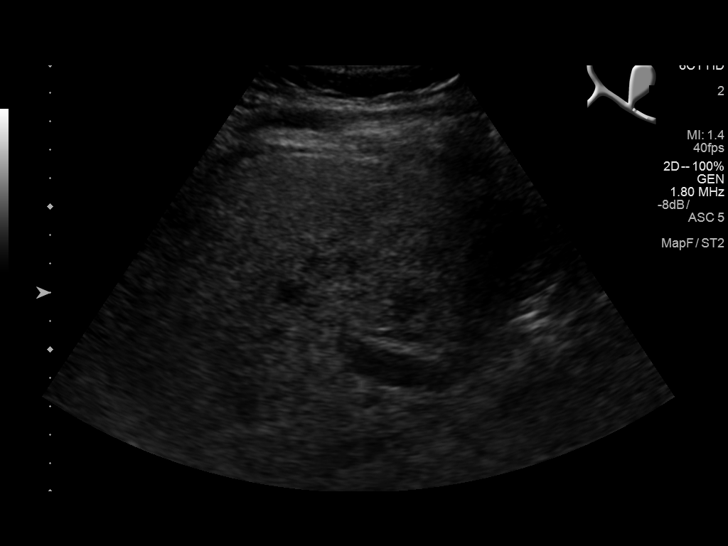
[im 22/40]
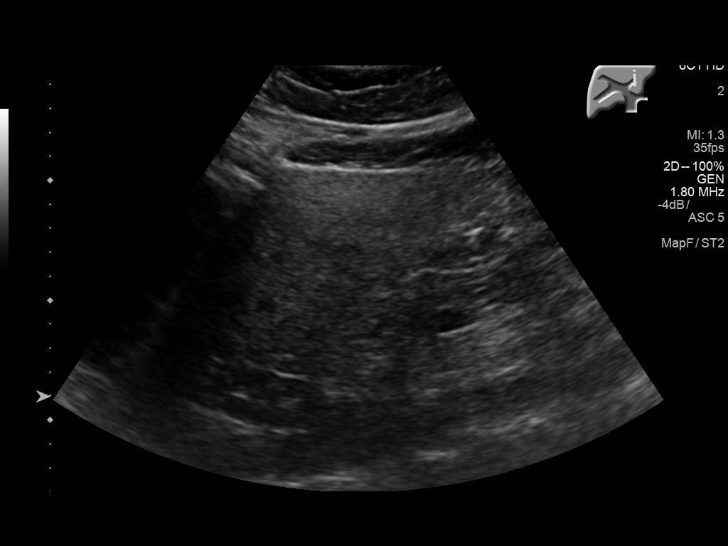
[im 25/40]
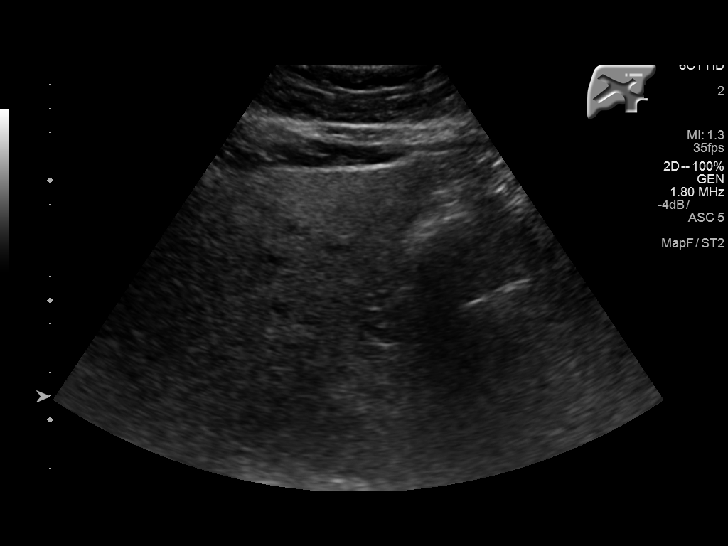
[im 27/40]
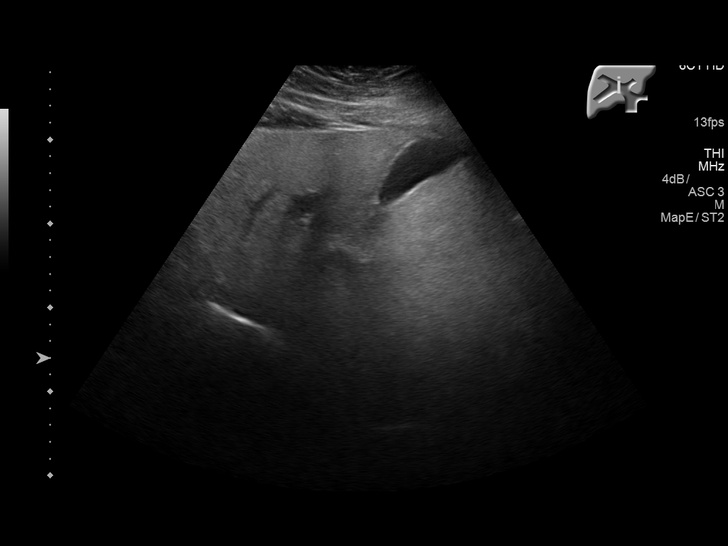
[im 30/40]
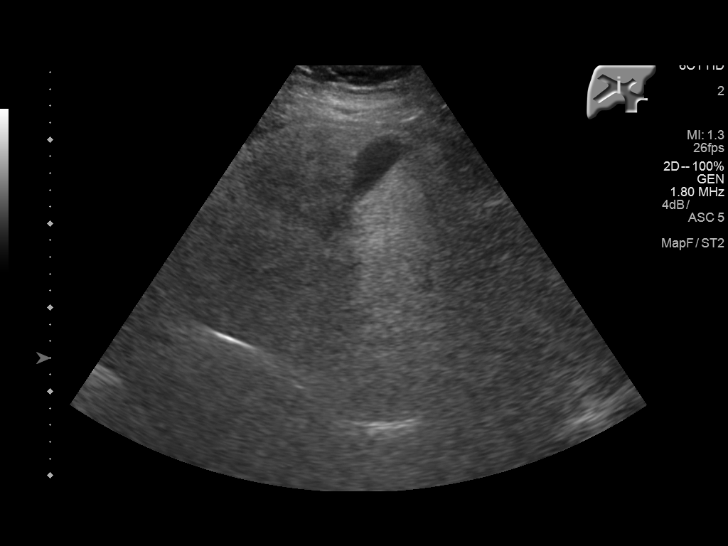
[im 33/40]
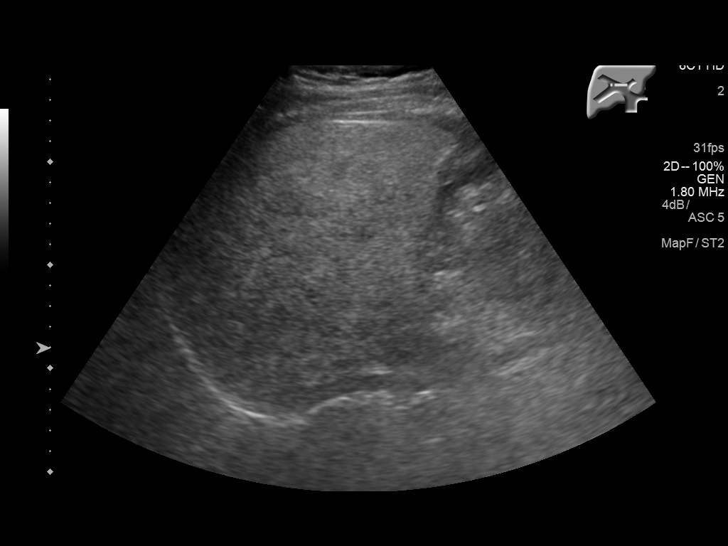
[im 36/40]
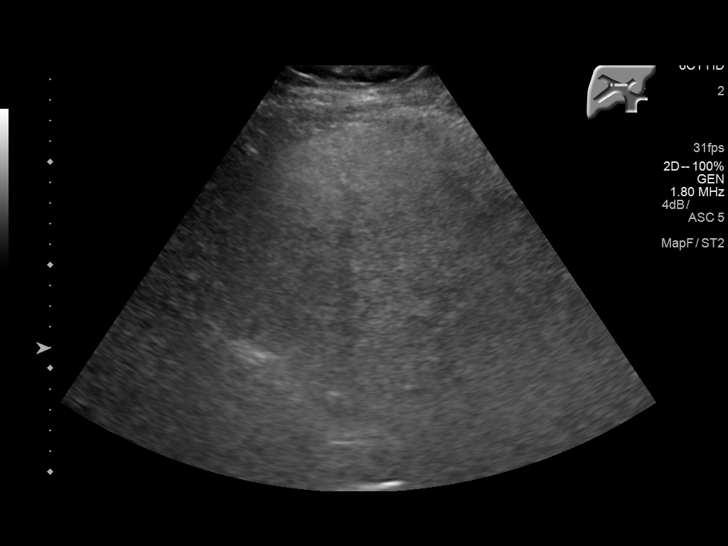
[im 40/40]
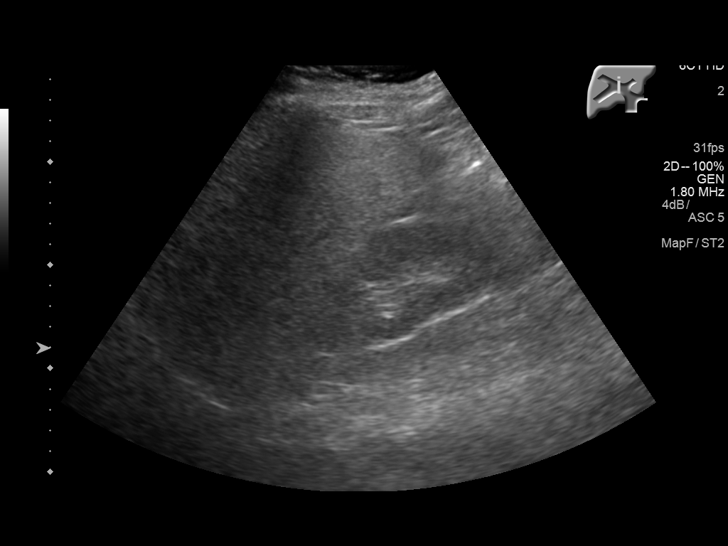

[14 of 25 positions shown; findings below may reference images not displayed]

FINDINGS: Gallbladder:

No gallstones or wall thickening visualized. No sonographic Murphy
sign noted by sonographer.

Common bile duct:

Diameter: 3 mm which is within normal limits.

Liver:

No focal lesion identified. Increased echogenicity is noted
consistent with fatty infiltration.
IMPRESSION: Increased echogenicity of hepatic parenchyma is noted consistent
with fatty infiltration. No other abnormality seen in the right
upper quadrant of the abdomen.

## 2018-10-04 ENCOUNTER — Ambulatory Visit: Payer: BLUE CROSS/BLUE SHIELD | Admitting: Podiatry

## 2018-10-18 ENCOUNTER — Other Ambulatory Visit: Payer: Self-pay | Admitting: Podiatry

## 2018-10-18 ENCOUNTER — Ambulatory Visit: Payer: BLUE CROSS/BLUE SHIELD | Admitting: Podiatry

## 2018-10-18 ENCOUNTER — Ambulatory Visit (INDEPENDENT_AMBULATORY_CARE_PROVIDER_SITE_OTHER): Payer: BLUE CROSS/BLUE SHIELD

## 2018-10-18 DIAGNOSIS — M779 Enthesopathy, unspecified: Secondary | ICD-10-CM | POA: Diagnosis not present

## 2018-10-18 DIAGNOSIS — M7752 Other enthesopathy of left foot: Secondary | ICD-10-CM

## 2018-10-18 DIAGNOSIS — L603 Nail dystrophy: Secondary | ICD-10-CM

## 2018-10-18 DIAGNOSIS — M778 Other enthesopathies, not elsewhere classified: Secondary | ICD-10-CM

## 2018-10-18 NOTE — Progress Notes (Signed)
  Subjective:  Patient ID: Hector Cohen, male    DOB: Mar 20, 1986,  MRN: [id number] HPI Chief Complaint  Patient presents with  . Foot Pain    L lateral ankle pain x 4 mo; 6/10 nerve pain Tx: none -pt states he had L anke sprain 13 years ago -pain is worst when working out    32 y.o. male presents with the above complaint.   ROS: He denies fever chills nausea vomiting muscle aches pains calf pain back pain chest pain shortness of breath.  No past medical history on file. No past surgical history on file.  Current Outpatient Medications:  .  citalopram (CELEXA) 20 MG tablet, Take by mouth., Disp: , Rfl:   Allergies  Allergen Reactions  . Penicillins Rash   Review of Systems Objective:  There were no vitals filed for this visit.  General: Well developed, nourished, in no acute distress, alert and oriented x3   Dermatological: Skin is warm, dry and supple bilateral. Nails x 10 are well maintained; remaining integument appears unremarkable at this time. There are no open sores, no preulcerative lesions, no rash or signs of infection present.  Normal-appearing hallux nails he will not grow left.  There is a small area of blood on the proximal nail fold.  Possibly associated with trauma.  Vascular: Dorsalis Pedis artery and Posterior Tibial artery pedal pulses are 2/4 bilateral with immedate capillary fill time. Pedal hair growth present. No varicosities and no lower extremity edema present bilateral.   Neruologic: Grossly intact via light touch bilateral. Vibratory intact via tuning fork bilateral. Protective threshold with Semmes Wienstein monofilament intact to all pedal sites bilateral. Patellar and Achilles deep tendon reflexes 2+ bilateral. No Babinski or clonus noted bilateral.   Musculoskeletal: No gross boney pedal deformities bilateral. No pain, crepitus, or limitation noted with foot and ankle range of motion bilateral. Muscular strength 5/5 in all groups tested  bilateral.  Full range of motion of the subtalar joint but the sinus tarsi is mildly tender.  Gait: Unassisted, Nonantalgic.    Radiographs:  Radiographs do not demonstrate any type of acute or osseous abnormalities.  Assessment & Plan:   Assessment: Sinus tarsitis subtalar joint capsulitis left.  Nail dystrophy hallux left.  Plan: Discussed appropriate shoe gear stretching exercise ice therapy sugar modifications.  He did not want a subtalar joint injection today we did discuss the possible need for orthotics.  We also discussed that the nail dystrophy may result in slow-growing or loss of the total nail plate.  Possibly are thick discolored non-attractive nail.     Dekendrick Uzelac T. [place], [place]

## 2018-10-18 NOTE — Progress Notes (Signed)
   Subjective:    Patient ID: Hector Cohen, male    DOB: 03-03-1986, 32 y.o.   MRN: [id number]  HPI    Review of Systems  Musculoskeletal: Positive for arthralgias and myalgias.  All other systems reviewed and are negative.      Objective:   Physical Exam        Assessment & Plan:

## 2019-08-02 ENCOUNTER — Ambulatory Visit: Payer: BC Managed Care – PPO | Admitting: Podiatry

## 2019-08-02 ENCOUNTER — Ambulatory Visit (INDEPENDENT_AMBULATORY_CARE_PROVIDER_SITE_OTHER): Payer: BC Managed Care – PPO

## 2019-08-02 ENCOUNTER — Other Ambulatory Visit: Payer: Self-pay

## 2019-08-02 DIAGNOSIS — M7751 Other enthesopathy of right foot: Secondary | ICD-10-CM

## 2019-08-02 DIAGNOSIS — L603 Nail dystrophy: Secondary | ICD-10-CM | POA: Diagnosis not present

## 2019-08-02 DIAGNOSIS — M778 Other enthesopathies, not elsewhere classified: Secondary | ICD-10-CM

## 2019-08-02 MED ORDER — DICLOFENAC SODIUM 75 MG PO TBEC: 75.0000 mg | DELAYED_RELEASE_TABLET | Freq: Two times a day (BID) | ORAL | 1 refills | Status: AC

## 2019-08-05 ENCOUNTER — Other Ambulatory Visit: Payer: Self-pay | Admitting: Podiatry

## 2019-08-05 DIAGNOSIS — M7751 Other enthesopathy of right foot: Secondary | ICD-10-CM

## 2019-08-05 NOTE — Progress Notes (Signed)
   HPI: 33 y.o. male presenting today pain to the dorsal right foot that began 1-2 weeks ago. He states the pain starts at his toes and radiates to the dorsal foot. Bending the toes increases the pain. He has been applying ice for treatment.  He also notes thickening of the left great toenail that has been ongoing for the past few months. He reports some intermittent tenderness when pressure is applied. He has not had any treatment for this complaint. Patient is here for further evaluation and treatment.   No past medical history on file.   Physical Exam: General: The patient is alert and oriented x3 in no acute distress.  Dermatology: Hyperkeratotic, discolored, thickened, onychodystrophy of the left great toenail. Skin is warm, dry and supple bilateral lower extremities. Negative for open lesions or macerations.  Vascular: Palpable pedal pulses bilaterally. No edema or erythema noted. Capillary refill within normal limits.  Neurological: Epicritic and protective threshold grossly intact bilaterally.   Musculoskeletal Exam: Pain with palpation noted to the extensor tendons of the right foot. Range of motion within normal limits to all pedal and ankle joints bilateral. Muscle strength 5/5 in all groups bilateral.   Radiographic Exam:  Normal osseous mineralization. Joint spaces preserved. No fracture/dislocation/boney destruction.    Assessment: 1. Extensor tendinitis right - improving  2. Dystrophic nail left great toe   Plan of Care:  1. Patient evaluated. X-Rays reviewed.  2. Prescription for Diclofenac provided to patient.  3. Mechanical debridement of the left great toenail performed using a nail nipper. Filed with dremel without incident.  4. Return to clinic as needed.    Goes by Hector Cohen.      Edrick Kins, DPM [facility]  Dr. Edrick Kins, DPM    [address], [place] [address]                Office  [phone]  Fax [phone]

## 2021-04-05 ENCOUNTER — Other Ambulatory Visit: Payer: Self-pay

## 2021-04-05 ENCOUNTER — Ambulatory Visit: Payer: BLUE CROSS/BLUE SHIELD | Admitting: Dermatology

## 2021-04-05 DIAGNOSIS — Q825 Congenital non-neoplastic nevus: Secondary | ICD-10-CM | POA: Diagnosis not present

## 2021-04-05 DIAGNOSIS — D492 Neoplasm of unspecified behavior of bone, soft tissue, and skin: Secondary | ICD-10-CM

## 2021-04-05 DIAGNOSIS — L409 Psoriasis, unspecified: Secondary | ICD-10-CM | POA: Diagnosis not present

## 2021-04-05 DIAGNOSIS — D225 Melanocytic nevi of trunk: Secondary | ICD-10-CM | POA: Diagnosis not present

## 2021-04-05 DIAGNOSIS — D229 Melanocytic nevi, unspecified: Secondary | ICD-10-CM

## 2021-04-05 DIAGNOSIS — L905 Scar conditions and fibrosis of skin: Secondary | ICD-10-CM

## 2021-04-05 DIAGNOSIS — L700 Acne vulgaris: Secondary | ICD-10-CM | POA: Diagnosis not present

## 2021-04-05 HISTORY — DX: Melanocytic nevi, unspecified: D22.9

## 2021-04-05 NOTE — Patient Instructions (Addendum)
If you have any questions or concerns for your doctor, please call our main line at [phone] and press option 4 to reach your doctor's medical assistant. If no one answers, please leave a voicemail as directed and we will return your call as soon as possible. Messages left after 4 pm will be answered the following business day.   You may also send us a message via MyChart. We typically respond to MyChart messages within 1-2 business days.  For prescription refills, please ask your pharmacy to contact our office. Our fax number is [phone].  If you have an urgent issue when the clinic is closed that cannot wait until the next business day, you can page your doctor at the number below.    Please note that while we do our best to be available for urgent issues outside of office hours, we are not available 24/7.   If you have an urgent issue and are unable to reach us, you may choose to seek medical care at your doctor's office, retail clinic, urgent care center, or emergency room.  If you have a medical emergency, please immediately call 911 or go to the emergency department.  Pager Numbers  - Dr. Kowalski: [phone]  - Dr. Moye: [phone]  - Dr. Stewart: [phone]  In the event of inclement weather, please call our main line at [phone] for an update on the status of any delays or closures.  Dermatology Medication Tips: Please keep the boxes that topical medications come in in order to help keep track of the instructions about where and how to use these. Pharmacies typically print the medication instructions only on the boxes and not directly on the medication tubes.   If your medication is too expensive, please contact our office at [phone] option 4 or send us a message through MyChart.   We are unable to tell what your co-pay for medications will be in advance as this is different depending on your insurance coverage. However, we may be able to find a substitute  medication at lower cost or fill out paperwork to get insurance to cover a needed medication.   If a prior authorization is required to get your medication covered by your insurance company, please allow us 1-2 business days to complete this process.  Drug prices often vary depending on where the prescription is filled and some pharmacies may offer cheaper prices.  The website www.goodrx.com contains coupons for medications through different pharmacies. The prices here do not account for what the cost may be with help from insurance (it may be cheaper with your insurance), but the website can give you the price if you did not use any insurance.  - You can print the associated coupon and take it with your prescription to the pharmacy.  - You may also stop by our office during regular business hours and pick up a GoodRx coupon card.  - If you need your prescription sent electronically to a different pharmacy, notify our office through [facility] MyChart or by phone at [phone] option 4.   Wound Care Instructions  Cleanse wound gently with soap and water once a day then pat dry with clean gauze. Apply a thing coat of Petrolatum (petroleum jelly, "Vaseline") over the wound (unless you have an allergy to this). We recommend that you use a new, sterile tube of Vaseline. Do not pick or remove scabs. Do not remove the yellow or white "healing tissue" from the base of the wound.  Cover the   wound with fresh, clean, nonstick gauze and secure with paper tape. You may use Band-Aids in place of gauze and tape if the would is small enough, but would recommend trimming much of the tape off as there is often too much. Sometimes Band-Aids can irritate the skin.  You should call the office for your biopsy report after 1 week if you have not already been contacted.  If you experience any problems, such as abnormal amounts of bleeding, swelling, significant bruising, significant pain, or evidence of infection,  please call the office immediately.  FOR ADULT SURGERY PATIENTS: If you need something for pain relief you may take 1 extra strength Tylenol (acetaminophen) AND 2 Ibuprofen (200mg each) together every 4 hours as needed for pain. (do not take these if you are allergic to them or if you have a reason you should not take them.) Typically, you may only need pain medication for 1 to 3 days.    

## 2021-04-05 NOTE — Progress Notes (Signed)
   New Patient Visit  Subjective  Hector Cohen is a 35 y.o. male who presents for the following: growths (Chest 2-29m/Neck 2wks) and Psoriasis (Scalp, yrs, selson blue shampoo). The patient presents for Upper Body Skin Exam (UBSE) for skin cancer screening and mole check.  Patient accompanied by mother who contributes to history.  The following portions of the chart were reviewed this encounter and updated as appropriate:   Allergies  Meds  Problems  Med Hx  Surg Hx  Fam Hx      Review of Systems:  No other skin or systemic complaints except as noted in HPI or Assessment and Plan.  Objective  Well appearing patient in no apparent distress; mood and affect are within normal limits.  All skin waist up examined.  face, neck Few scattered paps chin and scattered on face and neck  Left Lower Back 0.6cm irregular brown macule     R lat waistline ~2.0 x 1.0cm brown macule     R chest parasternal 0.4 hypertrophic scar     Scalp Mild scale scalp   Assessment & Plan   Melanocytic Nevi - Tan-brown and/or pink-flesh-colored symmetric macules and papules - Benign appearing on exam today - Observation - Call clinic for new or changing moles - Recommend daily use of broad spectrum spf 30+ sunscreen to sun-exposed areas.   Acne vulgaris face, neck Pt declines treatment  Neoplasm of skin Left Lower Back Epidermal / dermal shaving Lesion diameter (cm):  0.6 Informed consent: discussed and consent obtained   Timeout: patient name, date of birth, surgical site, and procedure verified   Procedure prep:  Patient was prepped and draped in usual sterile fashion Prep type:  Isopropyl alcohol Anesthesia: the lesion was anesthetized in a standard fashion   Anesthetic:  1% lidocaine w/ epinephrine 1-100,000 buffered w/ 8.4% NaHCO3 Instrument used: flexible razor blade   Hemostasis achieved with: pressure, aluminum chloride and electrodesiccation   Outcome:  patient tolerated procedure well   Post-procedure details: sterile dressing applied and wound care instructions given   Dressing type: bandage and bacitracin    Specimen 1 - Surgical pathology Differential Diagnosis: D48.5 Nevus vs Dysplastic nevus  Check Margins: yes 0.6cm irregular brown macule  Congenital non-neoplastic nevus R lat waistline Benign-appearing.  Observation.  Call clinic for new or changing moles.  Recommend daily use of broad spectrum spf 30+ sunscreen to sun-exposed areas.    Scar R chest parasternal Benign, observe Avoid picking  Psoriasis Scalp Hector Cohen Psoriasis Cont otc Selson blue Patient declines more aggressive treatment at this time.  Discussed treatment options. Patient's mother has psoriasis and psoriatic arthritis.  Psoriasis is a chronic non-curable, but treatable genetic/hereditary disease that may have other systemic features affecting other organ systems such as joints (Psoriatic Arthritis). It is associated with an increased risk of inflammatory bowel disease, heart disease, non-alcoholic fatty liver disease, and depression.    Return if symptoms worsen or fail to improve.  I, Othelia Pulling, RMA, am acting as scribe for Sarina Ser, MD . Documentation: I have reviewed the above documentation for accuracy and completeness, and I agree with the above.  Sarina Ser, MD

## 2021-04-09 ENCOUNTER — Encounter: Payer: Self-pay | Admitting: Dermatology

## 2021-04-11 ENCOUNTER — Ambulatory Visit: Payer: BLUE CROSS/BLUE SHIELD | Admitting: Dermatology

## 2021-04-16 ENCOUNTER — Telehealth: Payer: Self-pay

## 2021-04-16 NOTE — Telephone Encounter (Signed)
Spoke with pt and advised of results. He had no concerns. Scheduled pt for 1 year.

## 2021-04-16 NOTE — Telephone Encounter (Signed)
Left message on voicemail to return my call.  

## 2021-04-16 NOTE — Telephone Encounter (Signed)
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

## 2022-04-14 ENCOUNTER — Ambulatory Visit: Payer: BC Managed Care – PPO | Admitting: Dermatology

## 2023-04-29 ENCOUNTER — Other Ambulatory Visit: Payer: Self-pay | Admitting: Physician Assistant

## 2023-04-29 ENCOUNTER — Ambulatory Visit
Admission: RE | Admit: 2023-04-29 | Discharge: 2023-04-29 | Disposition: A | Discharge status: Home / Self Care | Payer: BC Managed Care – PPO | Source: Ambulatory Visit | Attending: Physician Assistant | Admitting: Physician Assistant

## 2023-04-29 DIAGNOSIS — R1013 Epigastric pain: Secondary | ICD-10-CM

## 2023-05-12 ENCOUNTER — Other Ambulatory Visit: Payer: Self-pay | Admitting: Internal Medicine

## 2023-05-12 DIAGNOSIS — R1032 Left lower quadrant pain: Secondary | ICD-10-CM

## 2023-05-18 ENCOUNTER — Ambulatory Visit
Admission: RE | Admit: 2023-05-18 | Discharge: 2023-05-18 | Disposition: A | Discharge status: Home / Self Care | Payer: BC Managed Care – PPO | Source: Ambulatory Visit | Attending: Internal Medicine | Admitting: Internal Medicine

## 2023-05-18 ENCOUNTER — Other Ambulatory Visit: Payer: BC Managed Care – PPO

## 2023-05-18 DIAGNOSIS — R1032 Left lower quadrant pain: Secondary | ICD-10-CM

## 2023-05-18 MED ORDER — IOPAMIDOL (ISOVUE-300) INJECTION 61%
100.0000 mL | Freq: Once | INTRAVENOUS | Status: AC | PRN
  Administered 2023-05-18: 100 mL via INTRAVENOUS

## 2023-05-20 ENCOUNTER — Ambulatory Visit: Payer: BC Managed Care – PPO | Admitting: Dermatology

## 2023-10-26 ENCOUNTER — Other Ambulatory Visit: Payer: Self-pay

## 2023-10-26 ENCOUNTER — Emergency Department: Payer: BC Managed Care – PPO

## 2023-10-26 ENCOUNTER — Emergency Department
Admission: EM | Admit: 2023-10-26 | Discharge: 2023-10-26 | Disposition: A | Discharge status: Home / Self Care | Payer: BC Managed Care – PPO | Attending: Student in an Organized Health Care Education/Training Program | Admitting: Student in an Organized Health Care Education/Training Program

## 2023-10-26 DIAGNOSIS — R0789 Other chest pain: Secondary | ICD-10-CM | POA: Insufficient documentation

## 2023-10-26 DIAGNOSIS — R079 Chest pain, unspecified: Secondary | ICD-10-CM | POA: Diagnosis present

## 2023-10-26 LAB — CBC
HCT: 44.7 % (ref 39.0–52.0)
Hemoglobin: 15.3 g/dL (ref 13.0–17.0)
MCH: 31.2 pg (ref 26.0–34.0)
MCHC: 34.2 g/dL (ref 30.0–36.0)
MCV: 91 fL (ref 80.0–100.0)
Platelets: 186 10*3/uL (ref 150–400)
RBC: 4.91 MIL/uL (ref 4.22–5.81)
RDW: 12.5 % (ref 11.5–15.5)
WBC: 6.9 10*3/uL (ref 4.0–10.5)
nRBC: 0 % (ref 0.0–0.2)

## 2023-10-26 LAB — BASIC METABOLIC PANEL
Anion gap: 10 (ref 5–15)
BUN: 15 mg/dL (ref 6–20)
CO2: 24 mmol/L (ref 22–32)
Calcium: 9.1 mg/dL (ref 8.9–10.3)
Chloride: 103 mmol/L (ref 98–111)
Creatinine, Ser: 1.1 mg/dL (ref 0.61–1.24)
GFR, Estimated: >60 mL/min (ref >60)
Glucose, Bld: 91 mg/dL (ref 70–99)
Potassium: 4.3 mmol/L (ref 3.5–5.1)
Sodium: 137 mmol/L (ref 135–145)

## 2023-10-26 LAB — TROPONIN I (HIGH SENSITIVITY)
Troponin I (High Sensitivity): 6 ng/L (ref <18)
Troponin I (High Sensitivity): 7 ng/L (ref <18)

## 2023-10-26 LAB — D-DIMER, QUANTITATIVE: D-Dimer, Quant: <0.27 ug{FEU}/mL (ref 0.00–0.50)

## 2023-10-26 MED ORDER — CYCLOBENZAPRINE HCL 10 MG PO TABS: 10.0000 mg | ORAL_TABLET | Freq: Three times a day (TID) | ORAL | 0 refills | Status: AC | PRN

## 2023-10-26 NOTE — ED Provider Notes (Signed)
[facility] Provider Note    Event Date/Time   First MD Initiated Contact with Patient 10/26/23 2015     (approximate)   History   Chest Pain   HPI  Hector Cohen is a 37 y.o. male who presents to the ER for evaluation of several months of weeks of migrating pains initially that then radiated to his back had extensive workup including CT imaging and blood work multiple times with no clear etiology.  Has also been having left anterior chest pain sometimes worse with movement.  Occasional have some shortness of breath with it.  Denies any fevers or chills.  Is under quite a bit of stress he cares for his disabled mother.  States he has not been getting much sleep.  Is doing quite a bit of exercise including cycling, treadmill and with lifting weights.     Physical Exam   Triage Vital Signs: ED Triage Vitals  Encounter Vitals Group     BP 10/26/23 1408 (!) 162/110     Systolic BP Percentile --      Diastolic BP Percentile --      Pulse Rate 10/26/23 1408 94     Resp 10/26/23 1408 18     Temp 10/26/23 1408 98.5 F (36.9 C)     Temp Source 10/26/23 1408 Oral     SpO2 10/26/23 1408 99 %     Weight 10/26/23 1409 300 lb (136.1 kg)     Height 10/26/23 1409 6\' 6"  (1.981 m)     Head Circumference --      Peak Flow --      Pain Score 10/26/23 1409 2     Pain Loc --      Pain Education --      Exclude from Growth Chart --     Most recent vital signs: Vitals:   10/26/23 1829 10/26/23 1842  BP: (!) 140/96   Pulse: 79   Resp: 18   Temp:  98 F (36.7 C)  SpO2: 97%      Constitutional: Alert  Eyes: Conjunctivae are normal.  Head: Atraumatic. Nose: No congestion/rhinnorhea. Mouth/Throat: Mucous membranes are moist.   Neck: Painless ROM.  Cardiovascular:   Good peripheral circulation. Respiratory: Normal respiratory effort.  No retractions.  Gastrointestinal: Soft and nontender.  Musculoskeletal:  no deformity Neurologic:  MAE  spontaneously. No gross focal neurologic deficits are appreciated.  Skin:  Skin is warm, dry and intact. No rash noted. Psychiatric: Mood and affect are normal. Speech and behavior are normal.    ED Results / Procedures / Treatments   Labs (all labs ordered are listed, but only abnormal results are displayed) Labs Reviewed  BASIC METABOLIC PANEL  CBC  D-DIMER, QUANTITATIVE  TROPONIN I (HIGH SENSITIVITY)  TROPONIN I (HIGH SENSITIVITY)     EKG  ED ECG REPORT I, Willy Eddy, the attending physician, personally viewed and interpreted this ECG.   Date: 10/26/2023  EKG Time: 14:20  Rate: 85  Rhythm: sinus  Axis: normal  Intervals: normal  ST&T Change: no stemi, no depressions    RADIOLOGY Please see ED Course for my review and interpretation.  I personally reviewed all radiographic images ordered to evaluate for the above acute complaints and reviewed radiology reports and findings.  These findings were personally discussed with the patient.  Please see medical record for radiology report.    PROCEDURES:  Critical Care performed: No  Procedures   MEDICATIONS ORDERED IN ED: Medications - No  data to display   IMPRESSION / MDM / ASSESSMENT AND PLAN / ED COURSE  I reviewed the triage vital signs and the nursing notes.                              Differential diagnosis includes, but is not limited to, ACS, pericarditis, esophagitis, boerhaaves, pe, dissection, pna, bronchitis, costochondritis  Patient presenting to the ER for evaluation of symptoms as described above.  Based on symptoms, risk factors and considered above differential, this presenting complaint could reflect a potentially life-threatening illness therefore the patient will be placed on continuous pulse oximetry and telemetry for monitoring.  Laboratory evaluation will be sent to evaluate for the above complaints.  Patient is clinically very well-appearing.  His EKG is nonischemic.  His troponin is  negative.  D-dimer negative.  Blood work otherwise reassuring.  Chest x-ray on my review and interpretation without evidence of thorax or contusion no consolidation no nodules.  Do not feel that further diagnostic imaging clinically indicated.  Patient does endorse quite a bit of stressors and I do suspect there is a component of overtraining and under recovery in his current regimen leading to additional musculoskeletal pains.  We discussed this at length.  I do believe he is stable and appropriate for outpatient follow-up.       FINAL CLINICAL IMPRESSION(S) / ED DIAGNOSES   Final diagnoses:  Atypical chest pain     Rx / DC Orders   ED Discharge Orders          Ordered    cyclobenzaprine (FLEXERIL) 10 MG tablet  3 times daily PRN        10/26/23 2033             Note:  This document was prepared using Dragon voice recognition software and may include unintentional dictation errors.    Willy Eddy, MD 10/26/23 2035

## 2023-10-26 NOTE — Discharge Instructions (Addendum)
Fish oil (looking for more than 500mg  of the omega 3 component) Unisom for sleep - take half or quarter tablet 20 min before bed Alleve 440-500mg  tablet twice daily for ten days

## 2023-10-26 NOTE — ED Triage Notes (Signed)
Pt presents to ED with c/o of chest pain since October that is worse with movement. Pt denies any pain on inspiration.

## 2023-10-26 NOTE — ED Provider Triage Note (Signed)
Emergency Medicine Provider Triage Evaluation Note  Hector Cohen , a 37 y.o. male  was evaluated in triage.  Pt complains of left shoulder pain since October, sent from [facility] for PE rule out, denies shortness of breath or chest pain.  Review of Systems  Positive:  Negative:   Physical Exam  BP (!) 162/110 (BP Location: Right Arm)   Pulse 94   Temp 98.5 F (36.9 C) (Oral)   Resp 18   Ht 6\' 6"  (1.981 m)   Wt 136.1 kg   SpO2 99%   BMI 34.67 kg/m  Gen:   Awake, no distress   Resp:  Normal effort  MSK:   Moves extremities without difficulty  Other:    Medical Decision Making  Medically screening exam initiated at 2:09 PM.  Appropriate orders placed.  Jaynie Crumble was informed that the remainder of the evaluation will be completed by another provider, this initial triage assessment does not replace that evaluation, and the importance of remaining in the ED until their evaluation is complete.  Since patient appears to be low risk for PE as send no surgeries, non-smoker, no hormone replacement therapy will do a D-dimer to assess if CTA actually needed   Faythe Ghee, PA-C 10/26/23 1411

## 2024-03-29 ENCOUNTER — Other Ambulatory Visit: Payer: Self-pay | Admitting: Family Medicine

## 2024-03-29 DIAGNOSIS — M5414 Radiculopathy, thoracic region: Secondary | ICD-10-CM

## 2024-04-01 ENCOUNTER — Inpatient Hospital Stay: Admission: RE | Admit: 2024-04-01 | Source: Ambulatory Visit

## 2024-04-01 ENCOUNTER — Ambulatory Visit
Admission: RE | Admit: 2024-04-01 | Discharge: 2024-04-01 | Disposition: A | Discharge status: Home / Self Care | Source: Ambulatory Visit | Attending: Family Medicine | Admitting: Family Medicine

## 2024-04-01 DIAGNOSIS — M5414 Radiculopathy, thoracic region: Secondary | ICD-10-CM

## 2024-06-06 ENCOUNTER — Other Ambulatory Visit: Payer: Self-pay | Admitting: Internal Medicine

## 2024-06-06 DIAGNOSIS — R1032 Left lower quadrant pain: Secondary | ICD-10-CM

## 2024-07-13 ENCOUNTER — Ambulatory Visit
Admission: RE | Admit: 2024-07-13 | Discharge: 2024-07-13 | Disposition: A | Discharge status: Home / Self Care | Source: Ambulatory Visit | Attending: Internal Medicine | Admitting: Internal Medicine

## 2024-07-13 DIAGNOSIS — R1032 Left lower quadrant pain: Secondary | ICD-10-CM

## 2024-07-13 MED ORDER — IOPAMIDOL (ISOVUE-300) INJECTION 61%
100.0000 mL | Freq: Once | INTRAVENOUS | Status: AC | PRN
  Administered 2024-07-13: 100 mL via INTRAVENOUS

## 2024-09-26 ENCOUNTER — Emergency Department

## 2024-09-26 ENCOUNTER — Emergency Department
Admission: EM | Admit: 2024-09-26 | Discharge: 2024-09-26 | Disposition: A | Discharge status: Home / Self Care | Attending: Emergency Medicine | Admitting: Emergency Medicine

## 2024-09-26 DIAGNOSIS — R0789 Other chest pain: Secondary | ICD-10-CM | POA: Diagnosis present

## 2024-09-26 DIAGNOSIS — R079 Chest pain, unspecified: Secondary | ICD-10-CM

## 2024-09-26 LAB — CBC
HCT: 44.2 % (ref 39.0–52.0)
Hemoglobin: 14.6 g/dL (ref 13.0–17.0)
MCH: 30.2 pg (ref 26.0–34.0)
MCHC: 33 g/dL (ref 30.0–36.0)
MCV: 91.3 fL (ref 80.0–100.0)
Platelets: 212 K/uL (ref 150–400)
RBC: 4.84 MIL/uL (ref 4.22–5.81)
RDW: 12.5 % (ref 11.5–15.5)
WBC: 6.7 K/uL (ref 4.0–10.5)
nRBC: 0 % (ref 0.0–0.2)

## 2024-09-26 LAB — BASIC METABOLIC PANEL WITH GFR
Anion gap: 9 (ref 5–15)
BUN: 14 mg/dL (ref 6–20)
CO2: 28 mmol/L (ref 22–32)
Calcium: 9.4 mg/dL (ref 8.9–10.3)
Chloride: 103 mmol/L (ref 98–111)
Creatinine, Ser: 1.19 mg/dL (ref 0.61–1.24)
GFR, Estimated: >60 mL/min (ref >60)
Glucose, Bld: 86 mg/dL (ref 70–99)
Potassium: 4.1 mmol/L (ref 3.5–5.1)
Sodium: 140 mmol/L (ref 135–145)

## 2024-09-26 LAB — TROPONIN T, HIGH SENSITIVITY: Troponin T High Sensitivity: <15 ng/L (ref 0–19)

## 2024-09-26 MED ORDER — HYDROXYZINE HCL 25 MG PO TABS: 25.0000 mg | ORAL_TABLET | Freq: Two times a day (BID) | ORAL | 0 refills | Status: AC | PRN

## 2024-09-26 NOTE — ED Triage Notes (Signed)
 Pt presents to the ED via POV from home. Pt reports 1/10 dull, sore pain across his chest that started yesterday. Pt reports a normal stress test two weeks ago.

## 2024-09-26 NOTE — ED Provider Notes (Signed)
 [facility] Provider Note    Event Date/Time   First MD Initiated Contact with Patient 09/26/24 RONOLD     (approximate)  History   Chief Complaint: Chest Pain  HPI  Hector Cohen is a 38 y.o. male with no significant past medical history presents emergency department for several days of chest discomfort.  According to the patient for the last couple years he has been experiencing intermittent chest pain, states he has had a workup including a stress test showing no findings.  Patient's mother is here with the patient states his father died of a heart attack as well as grandfather died of a heart attack, patient admits that he is constantly worried about this and has been under a lot of stress regarding this.  Patient states he has been experiencing various pains all over his body and his abdomen as well as extremities has been seeing his PCP for these and has been getting them worked up without any cause found.  Patient denies any shortness of breath nausea or diaphoresis.  Patient describes the discomfort more as a tightness somewhat worse with different movements of the upper torso.  Physical Exam   Triage Vital Signs: ED Triage Vitals  Encounter Vitals Group     BP 09/26/24 1734 (!) 142/99     Girls Systolic BP Percentile --      Girls Diastolic BP Percentile --      Boys Systolic BP Percentile --      Boys Diastolic BP Percentile --      Pulse Rate 09/26/24 1734 72     Resp 09/26/24 1734 18     Temp 09/26/24 1734 98.4 F (36.9 C)     Temp Source 09/26/24 1734 Oral     SpO2 09/26/24 1734 99 %     Weight 09/26/24 1732 (!) 340 lb (154.2 kg)     Height 09/26/24 1732 6' 6 (1.981 m)     Head Circumference --      Peak Flow --      Pain Score 09/26/24 1732 1     Pain Loc --      Pain Education --      Exclude from Growth Chart --     Most recent vital signs: Vitals:   09/26/24 1734  BP: (!) 142/99  Pulse: 72  Resp: 18  Temp: 98.4 F (36.9  C)  SpO2: 99%    General: Awake, no distress.  CV:  Good peripheral perfusion.  Regular rate and rhythm  Resp:  Normal effort.  Equal breath sounds bilaterally.  Chest is nontender to palpation. Abd:  No distention.  Soft, nontender.  No rebound or guarding.  ED Results / Procedures / Treatments   EKG  EKG viewed and interpreted by myself shows a normal sinus rhythm at 76 bpm with a narrow QRS, normal axis, normal intervals, no concerning ST changes.  RADIOLOGY  I have reviewed and interpreted chest x-ray images.  No obvious consolidation based on my evaluation. Radiology has read the x-ray as nodular opacity measuring 15 mm in the left upper lobe possible external artifact.  We will repeat a chest x-ray after removing artifacts (patient does have a large short logo in this area that could be contributing).   MEDICATIONS ORDERED IN ED: Medications - No data to display   IMPRESSION / MDM / ASSESSMENT AND PLAN / ED COURSE  I reviewed the triage vital signs and the nursing notes.  Patient's presentation  is most consistent with acute presentation with potential threat to life or bodily function.  Patient presents emergency department for several days of chest discomfort.  Overall the patient appears well reassuring physical exam, reassuring vital signs.  Patient's lab work is normal with a normal CBC normal chemistry negative troponin.  EKG shows no concerning findings, chest x-ray is negative besides a possible artifact we will repeat the x-ray with the patient short off to ensure the artifact is gone.  Suspect more stress/anxiety could be the cause of the patient's symptoms.  As long as the repeat x-ray shows no significant findings I anticipate likely discharge home on hydroxyzine and have the patient follow-up with his PCP.  Patient and mother agreeable to plan.  Patient's repeat chest x-ray is negative for abnormality.  FINAL CLINICAL IMPRESSION(S) / ED DIAGNOSES   Chest  pain   Note:  This document was prepared using Dragon voice recognition software and may include unintentional dictation errors.   [name], MD 09/26/24 2021

## 2024-10-24 ENCOUNTER — Ambulatory Visit: Payer: Self-pay

## 2024-10-24 DIAGNOSIS — K2950 Unspecified chronic gastritis without bleeding: Secondary | ICD-10-CM | POA: Diagnosis present
